# Patient Record
Sex: Male | Born: 1955 | Race: White | Hispanic: No | Marital: Married | Smoking: Former smoker
Health system: Southern US, Community
[De-identification: ages and names within clinical notes are randomized; demographics above are authoritative.]

## PROBLEM LIST (undated history)

## (undated) DIAGNOSIS — K5790 Diverticulosis of intestine, part unspecified, without perforation or abscess without bleeding: Secondary | ICD-10-CM

## (undated) DIAGNOSIS — D696 Thrombocytopenia, unspecified: Secondary | ICD-10-CM

## (undated) DIAGNOSIS — I10 Essential (primary) hypertension: Secondary | ICD-10-CM

## (undated) DIAGNOSIS — E785 Hyperlipidemia, unspecified: Secondary | ICD-10-CM

## (undated) DIAGNOSIS — L309 Dermatitis, unspecified: Secondary | ICD-10-CM

## (undated) DIAGNOSIS — K635 Polyp of colon: Secondary | ICD-10-CM

## (undated) DIAGNOSIS — R51 Headache: Secondary | ICD-10-CM

## (undated) DIAGNOSIS — R748 Abnormal levels of other serum enzymes: Secondary | ICD-10-CM

## (undated) DIAGNOSIS — D751 Secondary polycythemia: Secondary | ICD-10-CM

## (undated) DIAGNOSIS — R0989 Other specified symptoms and signs involving the circulatory and respiratory systems: Secondary | ICD-10-CM

## (undated) DIAGNOSIS — K219 Gastro-esophageal reflux disease without esophagitis: Secondary | ICD-10-CM

## (undated) DIAGNOSIS — M1990 Unspecified osteoarthritis, unspecified site: Secondary | ICD-10-CM

## (undated) DIAGNOSIS — G8929 Other chronic pain: Secondary | ICD-10-CM

## (undated) DIAGNOSIS — K222 Esophageal obstruction: Secondary | ICD-10-CM

## (undated) DIAGNOSIS — T7840XA Allergy, unspecified, initial encounter: Secondary | ICD-10-CM

## (undated) HISTORY — PX: CATARACT EXTRACTION W/ INTRAOCULAR LENS  IMPLANT, BILATERAL: SHX1307

## (undated) HISTORY — DX: Dermatitis, unspecified: L30.9

## (undated) HISTORY — DX: Other specified symptoms and signs involving the circulatory and respiratory systems: R09.89

## (undated) HISTORY — DX: Abnormal levels of other serum enzymes: R74.8

## (undated) HISTORY — DX: Thrombocytopenia, unspecified: D69.6

## (undated) HISTORY — PX: CERVICAL DISC SURGERY: SHX588

## (undated) HISTORY — DX: Other chronic pain: G89.29

## (undated) HISTORY — DX: Allergy, unspecified, initial encounter: T78.40XA

## (undated) HISTORY — PX: KNEE ARTHROSCOPY: SUR90

## (undated) HISTORY — DX: Secondary polycythemia: D75.1

## (undated) HISTORY — PX: BACK SURGERY: SHX140

## (undated) HISTORY — DX: Unspecified osteoarthritis, unspecified site: M19.90

## (undated) HISTORY — DX: Hyperlipidemia, unspecified: E78.5

## (undated) HISTORY — DX: Essential (primary) hypertension: I10

## (undated) HISTORY — DX: Polyp of colon: K63.5

## (undated) HISTORY — DX: Esophageal obstruction: K22.2

## (undated) HISTORY — DX: Headache: R51

## (undated) HISTORY — DX: Diverticulosis of intestine, part unspecified, without perforation or abscess without bleeding: K57.90

## (undated) HISTORY — DX: Gastro-esophageal reflux disease without esophagitis: K21.9

## (undated) HISTORY — PX: UPPER GASTROINTESTINAL ENDOSCOPY: SHX188

---

## 1999-04-17 HISTORY — PX: ESOPHAGOGASTRODUODENOSCOPY: SHX1529

## 2000-03-06 ENCOUNTER — Ambulatory Visit (HOSPITAL_COMMUNITY): Admission: RE | Admit: 2000-03-06 | Discharge: 2000-03-06 | Payer: Self-pay | Admitting: Internal Medicine

## 2000-03-06 ENCOUNTER — Encounter: Payer: Self-pay | Admitting: Internal Medicine

## 2000-04-16 HISTORY — PX: OTHER SURGICAL HISTORY: SHX169

## 2000-08-29 ENCOUNTER — Encounter: Admission: RE | Admit: 2000-08-29 | Discharge: 2000-08-29 | Payer: Self-pay | Admitting: Family Medicine

## 2000-08-29 ENCOUNTER — Encounter: Payer: Self-pay | Admitting: Family Medicine

## 2002-04-15 ENCOUNTER — Emergency Department (HOSPITAL_COMMUNITY): Admission: EM | Admit: 2002-04-15 | Discharge: 2002-04-15 | Payer: Self-pay | Admitting: Emergency Medicine

## 2002-07-25 ENCOUNTER — Encounter: Payer: Self-pay | Admitting: Emergency Medicine

## 2002-07-25 ENCOUNTER — Emergency Department (HOSPITAL_COMMUNITY): Admission: EM | Admit: 2002-07-25 | Discharge: 2002-07-25 | Payer: Self-pay | Admitting: Emergency Medicine

## 2002-07-27 ENCOUNTER — Encounter: Payer: Self-pay | Admitting: Orthopedic Surgery

## 2002-07-27 ENCOUNTER — Inpatient Hospital Stay (HOSPITAL_COMMUNITY): Admission: EM | Admit: 2002-07-27 | Discharge: 2002-08-10 | Payer: Self-pay | Admitting: Orthopedic Surgery

## 2002-07-29 ENCOUNTER — Encounter: Payer: Self-pay | Admitting: Orthopedic Surgery

## 2002-07-30 ENCOUNTER — Encounter: Payer: Self-pay | Admitting: Infectious Diseases

## 2002-07-30 ENCOUNTER — Encounter: Payer: Self-pay | Admitting: Orthopedic Surgery

## 2002-08-01 ENCOUNTER — Encounter: Payer: Self-pay | Admitting: Orthopedic Surgery

## 2002-08-03 ENCOUNTER — Encounter (INDEPENDENT_AMBULATORY_CARE_PROVIDER_SITE_OTHER): Payer: Self-pay | Admitting: Cardiovascular Disease

## 2002-08-03 ENCOUNTER — Encounter: Payer: Self-pay | Admitting: Orthopedic Surgery

## 2002-09-01 ENCOUNTER — Encounter: Payer: Self-pay | Admitting: Orthopedic Surgery

## 2002-09-01 ENCOUNTER — Encounter: Admission: RE | Admit: 2002-09-01 | Discharge: 2002-09-01 | Payer: Self-pay | Admitting: Orthopedic Surgery

## 2002-09-22 ENCOUNTER — Encounter: Admission: RE | Admit: 2002-09-22 | Discharge: 2002-09-22 | Payer: Self-pay | Admitting: Infectious Diseases

## 2002-10-21 ENCOUNTER — Encounter: Admission: RE | Admit: 2002-10-21 | Discharge: 2002-10-21 | Payer: Self-pay | Admitting: Infectious Diseases

## 2002-11-23 ENCOUNTER — Encounter: Admission: RE | Admit: 2002-11-23 | Discharge: 2002-11-23 | Payer: Self-pay | Admitting: Infectious Diseases

## 2003-01-13 ENCOUNTER — Encounter: Admission: RE | Admit: 2003-01-13 | Discharge: 2003-01-13 | Payer: Self-pay | Admitting: Infectious Diseases

## 2003-03-15 ENCOUNTER — Encounter: Admission: RE | Admit: 2003-03-15 | Discharge: 2003-03-15 | Payer: Self-pay | Admitting: Infectious Diseases

## 2004-02-22 ENCOUNTER — Ambulatory Visit: Payer: Self-pay | Admitting: Family Medicine

## 2004-04-07 ENCOUNTER — Ambulatory Visit: Payer: Self-pay | Admitting: Internal Medicine

## 2004-04-18 ENCOUNTER — Ambulatory Visit: Payer: Self-pay | Admitting: Family Medicine

## 2004-04-20 ENCOUNTER — Ambulatory Visit: Payer: Self-pay | Admitting: Family Medicine

## 2004-06-23 ENCOUNTER — Ambulatory Visit: Payer: Self-pay | Admitting: Family Medicine

## 2004-10-19 ENCOUNTER — Ambulatory Visit: Payer: Self-pay | Admitting: Family Medicine

## 2005-04-16 HISTORY — PX: CHOLECYSTECTOMY: SHX55

## 2005-06-06 ENCOUNTER — Ambulatory Visit: Payer: Self-pay | Admitting: Internal Medicine

## 2005-09-06 ENCOUNTER — Emergency Department (HOSPITAL_COMMUNITY): Admission: EM | Admit: 2005-09-06 | Discharge: 2005-09-07 | Payer: Self-pay | Admitting: Emergency Medicine

## 2005-09-17 ENCOUNTER — Emergency Department (HOSPITAL_COMMUNITY): Admission: EM | Admit: 2005-09-17 | Discharge: 2005-09-17 | Payer: Self-pay | Admitting: Emergency Medicine

## 2006-02-19 ENCOUNTER — Ambulatory Visit: Payer: Self-pay | Admitting: Family Medicine

## 2006-04-11 ENCOUNTER — Ambulatory Visit: Payer: Self-pay | Admitting: Internal Medicine

## 2006-04-11 ENCOUNTER — Inpatient Hospital Stay (HOSPITAL_COMMUNITY): Admission: EM | Admit: 2006-04-11 | Discharge: 2006-04-15 | Payer: Self-pay | Admitting: Emergency Medicine

## 2006-04-13 ENCOUNTER — Encounter (INDEPENDENT_AMBULATORY_CARE_PROVIDER_SITE_OTHER): Payer: Self-pay | Admitting: Specialist

## 2006-04-15 ENCOUNTER — Ambulatory Visit: Payer: Self-pay | Admitting: Gastroenterology

## 2006-04-22 ENCOUNTER — Ambulatory Visit: Payer: Self-pay | Admitting: Family Medicine

## 2006-05-03 ENCOUNTER — Ambulatory Visit: Payer: Self-pay | Admitting: Family Medicine

## 2006-05-03 LAB — CONVERTED CEMR LAB
ALT: 66 units/L — ABNORMAL HIGH (ref 0–40)
AST: 54 units/L — ABNORMAL HIGH (ref 0–37)
Albumin: 3.5 g/dL (ref 3.5–5.2)
Alkaline Phosphatase: 138 units/L — ABNORMAL HIGH (ref 39–117)
Basophils Absolute: 0.1 10*3/uL (ref 0.0–0.1)
Basophils Relative: 1 % (ref 0.0–1.0)
Bilirubin, Direct: 0.5 mg/dL — ABNORMAL HIGH (ref 0.0–0.3)
Eosinophils Relative: 3.7 % (ref 0.0–5.0)
HCT: 48.4 % (ref 39.0–52.0)
Hemoglobin: 16.2 g/dL (ref 13.0–17.0)
Lymphocytes Relative: 21.5 % (ref 12.0–46.0)
MCHC: 33.4 g/dL (ref 30.0–36.0)
MCV: 85 fL (ref 78.0–100.0)
Monocytes Absolute: 0.5 10*3/uL (ref 0.2–0.7)
Monocytes Relative: 9.2 % (ref 3.0–11.0)
Neutro Abs: 3.3 10*3/uL (ref 1.4–7.7)
Neutrophils Relative %: 64.6 % (ref 43.0–77.0)
Platelets: 206 10*3/uL (ref 150–400)
RBC: 5.69 M/uL (ref 4.22–5.81)
RDW: 12.4 % (ref 11.5–14.6)
Total Bilirubin: 1.4 mg/dL — ABNORMAL HIGH (ref 0.3–1.2)
Total Protein: 6.3 g/dL (ref 6.0–8.3)
WBC: 5.2 10*3/uL (ref 4.5–10.5)

## 2006-05-17 ENCOUNTER — Ambulatory Visit: Payer: Self-pay | Admitting: Internal Medicine

## 2007-01-10 ENCOUNTER — Encounter: Payer: Self-pay | Admitting: Family Medicine

## 2007-02-18 IMAGING — NM NM LIVER FUNCTION STUDY
1 series · 6 of 6 positions shown · non-contrast
Comparison: None. Ultrasound exam from earlier today has been reviewed.

CLINICAL DATA: Gallstones with prominent gallbladder wall on recent ultrasound

NM HEPATOBILIARY SCAN
TECHNIQUE: Sequential abdominal images were obtained following intravenous
injection of radiopharmaceutical.
Radiopharmaceutical:  5 mCi Wc-88m Choletec

[Series 1: he hepatobiliary · 3.21mm/px · 6 of 60 frames shown]
[frame 6/60]
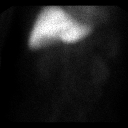
[frame 16/60]
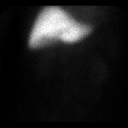
[frame 26/60]
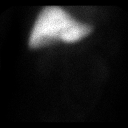
[frame 36/60]
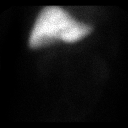
[frame 46/60]
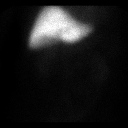
[frame 56/60]
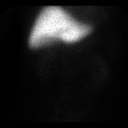

[6 of 6 positions shown; findings below may reference images not displayed]

FINDINGS: There is relatively good radiotracer uptake by the hepatic parenchyma
with blood pool activity cleared by 6-7 minutes. However no excretion of
radiotracer from the hepatic parenchyma is identified. There is no evidence for
biliary activity and no common duct activity is present by the 60 minute
acquisition.

IMPRESSION

No biliary excretion of radiotracer by 60 minutes after radioisotope injection.
The 2 main differential considerations for this appearance are acute common duct
obstruction or hepatocyte dysfunction. Given that the radiotracer uptake by the
hepatocytes appears to be normal, with appropriate clearance of activity from
the blood pool, an acute common duct obstruction is considered more likely.
While the ultrasound performed earlier today did not demonstrate biliary
dilation, this may represent an acute process with the ultrasound exam obtained
before biliary distention has occurred. Close clinical correlation is
recommended.

I called these results directly to Dr. Deeqa Rayaan at the time of study
interpretation.

## 2007-03-28 ENCOUNTER — Encounter: Payer: Self-pay | Admitting: Family Medicine

## 2007-06-04 ENCOUNTER — Ambulatory Visit: Payer: Self-pay | Admitting: Internal Medicine

## 2009-02-28 ENCOUNTER — Ambulatory Visit: Payer: Self-pay | Admitting: Family Medicine

## 2009-02-28 DIAGNOSIS — E1169 Type 2 diabetes mellitus with other specified complication: Secondary | ICD-10-CM | POA: Insufficient documentation

## 2009-02-28 DIAGNOSIS — R030 Elevated blood-pressure reading, without diagnosis of hypertension: Secondary | ICD-10-CM | POA: Insufficient documentation

## 2009-02-28 DIAGNOSIS — E785 Hyperlipidemia, unspecified: Secondary | ICD-10-CM

## 2009-03-01 LAB — CONVERTED CEMR LAB
Albumin: 4.1 g/dL (ref 3.5–5.2)
Alkaline Phosphatase: 123 units/L — ABNORMAL HIGH (ref 39–117)
Basophils Absolute: 0 10*3/uL (ref 0.0–0.1)
Basophils Relative: 0.1 % (ref 0.0–3.0)
Bilirubin, Direct: 0.1 mg/dL (ref 0.0–0.3)
CO2: 32 meq/L (ref 19–32)
Calcium: 9.5 mg/dL (ref 8.4–10.5)
Chloride: 101 meq/L (ref 96–112)
Creatinine, Ser: 1 mg/dL (ref 0.4–1.5)
Eosinophils Relative: 2 % (ref 0.0–5.0)
HCT: 54.7 % — ABNORMAL HIGH (ref 39.0–52.0)
Hemoglobin: 17.9 g/dL — ABNORMAL HIGH (ref 13.0–17.0)
LDL Cholesterol: 93 mg/dL (ref 0–99)
Lymphocytes Relative: 19.3 % (ref 12.0–46.0)
MCHC: 32.7 g/dL (ref 30.0–36.0)
Monocytes Absolute: 0.7 10*3/uL (ref 0.1–1.0)
Neutro Abs: 4.8 10*3/uL (ref 1.4–7.7)
Neutrophils Relative %: 68.6 % (ref 43.0–77.0)
PSA: 2.01 ng/mL (ref 0.10–4.00)
Platelets: 136 10*3/uL — ABNORMAL LOW (ref 150.0–400.0)
Potassium: 4 meq/L (ref 3.5–5.1)
RDW: 12.2 % (ref 11.5–14.6)
Total Bilirubin: 0.9 mg/dL (ref 0.3–1.2)
Total Protein: 7 g/dL (ref 6.0–8.3)
Triglycerides: 183 mg/dL — ABNORMAL HIGH (ref 0.0–149.0)

## 2009-03-08 ENCOUNTER — Ambulatory Visit: Payer: Self-pay | Admitting: Family Medicine

## 2009-03-09 ENCOUNTER — Encounter (INDEPENDENT_AMBULATORY_CARE_PROVIDER_SITE_OTHER): Payer: Self-pay | Admitting: *Deleted

## 2009-05-31 ENCOUNTER — Ambulatory Visit: Payer: Self-pay | Admitting: Family Medicine

## 2009-06-03 LAB — CONVERTED CEMR LAB
ALT: 36 units/L (ref 0–53)
AST: 36 units/L (ref 0–37)
Albumin: 3.9 g/dL (ref 3.5–5.2)
Alkaline Phosphatase: 106 units/L (ref 39–117)
Basophils Relative: 0.8 % (ref 0.0–3.0)
CO2: 30 meq/L (ref 19–32)
Calcium: 9.8 mg/dL (ref 8.4–10.5)
Eosinophils Relative: 2.2 % (ref 0.0–5.0)
Hemoglobin: 16.3 g/dL (ref 13.0–17.0)
Lymphocytes Relative: 22.3 % (ref 12.0–46.0)
Lymphs Abs: 1.2 10*3/uL (ref 0.7–4.0)
MCHC: 34.4 g/dL (ref 30.0–36.0)
Monocytes Relative: 9.5 % (ref 3.0–12.0)
Neutro Abs: 3.5 10*3/uL (ref 1.4–7.7)
Neutrophils Relative %: 65.2 % (ref 43.0–77.0)
Potassium: 4.1 meq/L (ref 3.5–5.1)
Total Bilirubin: 0.6 mg/dL (ref 0.3–1.2)
Total CHOL/HDL Ratio: 4
Total Protein: 6.5 g/dL (ref 6.0–8.3)
Triglycerides: 190 mg/dL — ABNORMAL HIGH (ref 0.0–149.0)
WBC: 5.3 10*3/uL (ref 4.5–10.5)

## 2009-11-28 ENCOUNTER — Ambulatory Visit: Payer: Self-pay | Admitting: Family Medicine

## 2009-11-28 LAB — CONVERTED CEMR LAB
ALT: 35 units/L (ref 0–53)
Alkaline Phosphatase: 144 units/L — ABNORMAL HIGH (ref 39–117)
BUN: 20 mg/dL (ref 6–23)
Basophils Absolute: 0 10*3/uL (ref 0.0–0.1)
CO2: 29 meq/L (ref 19–32)
Calcium: 9.4 mg/dL (ref 8.4–10.5)
Cholesterol: 162 mg/dL (ref 0–200)
Eosinophils Relative: 3.7 % (ref 0.0–5.0)
LDL Cholesterol: 92 mg/dL (ref 0–99)
Lymphocytes Relative: 24.9 % (ref 12.0–46.0)
Monocytes Relative: 12.7 % — ABNORMAL HIGH (ref 3.0–12.0)
Neutro Abs: 3.2 10*3/uL (ref 1.4–7.7)
PSA: 1.36 ng/mL (ref 0.10–4.00)
Platelets: 133 10*3/uL — ABNORMAL LOW (ref 150.0–400.0)
Potassium: 4.2 meq/L (ref 3.5–5.1)
RBC: 5.61 M/uL (ref 4.22–5.81)
RDW: 14 % (ref 11.5–14.6)
Sodium: 142 meq/L (ref 135–145)
Total CHOL/HDL Ratio: 5
Total Protein: 6.3 g/dL (ref 6.0–8.3)
VLDL: 38.8 mg/dL (ref 0.0–40.0)

## 2009-12-02 ENCOUNTER — Ambulatory Visit: Payer: Self-pay | Admitting: Family Medicine

## 2010-01-30 ENCOUNTER — Encounter: Payer: Self-pay | Admitting: Family Medicine

## 2010-02-15 ENCOUNTER — Ambulatory Visit: Payer: Self-pay | Admitting: Internal Medicine

## 2010-05-18 NOTE — Letter (Signed)
Summary: [facility]  [facility]   Imported By: Lanelle Bal 02/08/2010 13:47:33  _____________________________________________________________________  External Attachment:    Type:   Image     Comment:   External Document

## 2010-05-18 NOTE — Assessment & Plan Note (Signed)
Summary: BACK PAIN/CLE   Vital Signs:  Patient profile:   55 year old male Weight:      213.50 pounds Temp:     98.2 degrees F oral Pulse rate:   76 / minute Pulse rhythm:   regular BP sitting:   140 / 100  (left arm) Cuff size:   large  Vitals Entered By: Selena Batten Dance CMA (AAMA) (February 15, 2010 11:31 AM) CC: LBP   History of Present Illness: CC: LBP  1 1/2 mo h/o lower back pain.  Started when went to tie shoe, felt pulled something in back (started on R side).  Saw Dr. Juliene Pina 2 wks ago, didn't find any abnormality, didn't have any pain, told fine.  Last week was working on computer in truck, felt stabbing sensation L side.  h/o back surgery for ruptured disc with staph infection.  Has tried advil, Kingsley Callander, goody powders.    Mainly nuisance.  Pain worse with transition from sitting to standing or when lifting something heavy, pain can get excruciating.  Using back brace.  Feels pain localized left lower back, radiates to L lateral hip.  No radiculopathy down leg, no numbness, weakness, no bowel/bladder incontinence.    Voiding more often, has been drinking more.  No dysuria, urgency.  No fevers/chills.  No nausea/vomiting.  Allergies: No Known Drug Allergies  Past History:  Past Medical History: Last updated: 02/28/2009 eczema hyperlipidemia GERD with HH elevated liver enzymes  allergies labile bp   Past Surgical History: Last updated: 02/28/2009 01 EGD stricture/GERD and HH R knee surg 02 abd [place] neg  disc surgery with staph infx [facility] 07 PMH-FH-SH reviewed for relevance  Social History: Reviewed history from 02/28/2009 and no changes required. quit smoking over 20 years ago  alcohol - is rare to none  some exercise  married   Review of Systems       per HPI  Physical Exam  General:  Well-developed,well-nourished,in no acute distress; alert,appropriate and cooperative throughout examination Msk:  no midline spine tenderness.  no significant paraspinous  mm tenderness.  limited ROM at spine 2/2 stiffness, inflexibility.  No pain with ROM.  No SI joint pain, negative FABER test.  No greater trochanteric pain, no pain with int/ext rotation at hip.  negative SLR test.  tenderness to palpation L back about 2cm superior to posterior iliac crest.  no CVA tenderness Pulses:  2+ periph pulses Extremities:  no pedal edema, no palp cords Neurologic:  sensation intact to light touch, station and gait intact   Impression & Recommendations:  Problem # 1:  LUMBAR STRAIN (ICD-847.2) no red flags.  treat conservatively for now, see pt instructions.  red flags to return dsicused, if not improving to return.  consider UA vs Xray.  Complete Medication List: 1)  Lansoprazole 30 Mg Cpdr (Lansoprazole) .Marland Kitchen.. 1 capsule by mouth once daily 2)  Multivitamins Tabs (Multiple vitamin) .... One daily 3)  Vitamin B Complex-c Caps (B complex-c) .... One daily 4)  Calcium Carbonate-vitamin D 600-400 Mg-unit Tabs (Calcium carbonate-vitamin d) .... One daily 5)  Isochrome Otc  .... Otc as directed. 6)  Opc3  .... Otc as directed. 7)  Advil 200 Mg Tabs (Ibuprofen) .... Otc as directed. 8)  Goodys Body Pain 500-325 Mg Pack (Aspirin-acetaminophen) .... Otc as directed. 9)  Epipen 0.3 Mg/0.85ml Devi (Epinephrine) .... Inject one time as directed as needed allergic reaction 10)  Mobic 7.5 Mg Tabs (Meloxicam) .... One daily for 7 dyas then as needed (  don't take with any other goody or advil) 11)  Flexeril 10 Mg Tabs (Cyclobenzaprine hcl) .... One by mouth two times a day x 7 dyas then as needed muscle spasm  Patient Instructions: 1)  I think you have a lumbar strain. 2)  Treat with anti inflammatory for 7 days with food, as well as muscle relaxant flexeril two times a day, can make you sleepy so start at night.  (no advil, goody powder, alleve while on mobic). 3)  Handout today. 4)  Good to see you call clinic with quesitons. 5)  if not better in 2 wks, please return to be  seen.  if worsening, return sooner. Prescriptions: MOBIC 7.5 MG TABS (MELOXICAM) one daily for 7 dyas then as needed (don't take with any other goody or advil)  #30 x 0   Entered and Authorized by:   Eustaquio Boyden  MD   Signed by:   Eustaquio Boyden  MD on 02/15/2010   Method used:   Electronically to        [facility]  [address] [id number] (retail[facility]       [address]       [place], [place]  [address]       Ph: [phone] or [phone]       Fax: [phone]   RxID:   [id number] FLEXERIL 10 MG TABS (CYCLOBENZAPRINE HCL) one by mouth two times a day x 7 dyas then as needed muscle spasm  #30 x 0   Entered and Authorized by:   Eustaquio Boyden  MD   Signed by:   Eustaquio Boyden  MD on 02/15/2010   Method used:   [facility] to        [facility]  [address] [facility]ret[facility]       [address]       [place], [place]  [address]       Ph: [phone] or [phone]       Fax: [phone]   RxID:   [id number] MOBIC 7.5 MG TABS (MELOXICAM) one daily for 7 dyas then as needed (don't take with any other goody or advil)  #30 x 0   Entered and Authorized by:   Eustaquio Boyden  MD   Signed by:   Eustaquio Boyden  MD on 02/15/2010   Method used:   Electronically to        [facility] [phone]* (retail)       [address]       [place], [place]  [address]       Ph: [phone]       Fax: [phone]   RxID:   [id number]    Orders Added: 1)  Est. Patient Level III [82423]    Current Allergies (reviewed today): No known allergies

## 2010-05-18 NOTE — Assessment & Plan Note (Signed)
Summary: F/U AFTER LABS / LFW   Vital Signs:  Patient profile:   55 year old male Height:      72.25 inches Weight:      217.75 pounds BMI:     29.43 Temp:     97.8 degrees F oral Pulse rate:   72 / minute Pulse rhythm:   regular BP sitting:   120 / 80  (left arm) Cuff size:   large  Vitals Entered By: Lewanda Rife LPN (December 02, 2009 8:13 AM) CC: follow-up visit   History of Present Illness: pt here for f/u of lipids and bp and hyperglycemia   is feeling good overall  a busy summer  got some swelling in feet at the beach -- given shot of steroid and then prednisone (from sunburn)  glucose 111 and AIC 5.8  platelets 133-- no change   alk phos 144- thins tends to go up and down -baseline for him  wt is up 1 lb here-- lost 2 lb by scales at home  did start cutting down on his portions   bp good today 120/80  Last Lipid ProfileCholesterol: 162 (11/28/2009 8:47:40 AM)HDL:  31.70 (11/28/2009 8:47:40 AM)LDL:  92 (11/28/2009 8:47:40 AM)Triglycerides:  Last Liver profileSGOT:  35 (11/28/2009 8:47:40 AM)SPGT:  35 (11/28/2009 8:47:40 AM)T. Bili:  0.9 (11/28/2009 8:47:40 AM)Alk Phos:  144 (11/28/2009 8:47:40 AM)  this is fairly stable   psa is normal- lower than last time  no urination trouble at all -- no nocturia   Allergies (verified): No Known Drug Allergies  Past History:  Past Medical History: Last updated: 02/28/2009 eczema hyperlipidemia GERD with HH elevated liver enzymes  allergies labile bp   Past Surgical History: Last updated: 02/28/2009 01 EGD stricture/GERD and HH R knee surg 02 abd [place] neg  disc surgery with staph infx ccy 07  Family History: Last updated: 02/28/2009 mother father HTN, passed of heart problems (? heart dz)/ CABG lung dz (? auto immune)  MGF MI in his 77s   Social History: Last updated: 02/28/2009 quit smoking over 20 years ago  alcohol - is rare to none  some exercise  married   Review of Systems General:  Denies  fatigue, fever, loss of appetite, and malaise. Eyes:  Denies blurring and eye irritation. CV:  Denies chest pain or discomfort, fatigue, shortness of breath with exertion, and swelling of feet. Resp:  Denies cough, shortness of breath, and wheezing. GI:  Denies abdominal pain, bloody stools, change in bowel habits, indigestion, and nausea. GU:  Denies nocturia, urinary frequency, and urinary hesitancy. MS:  Denies joint pain and muscle aches. Derm:  Denies itching, lesion(s), poor wound healing, and rash. Neuro:  Denies numbness and tingling. Psych:  Denies anxiety and depression. Endo:  Denies cold intolerance, excessive thirst, excessive urination, and heat intolerance. Heme:  Denies abnormal bruising and bleeding.  Physical Exam  General:  Well-developed,well-nourished,in no acute distress; alert,appropriate and cooperative throughout examination Head:  normocephalic, atraumatic, and no abnormalities observed.   Eyes:  vision grossly intact, pupils equal, pupils round, and pupils reactive to light.  no conjunctival pallor, injection or icterus  Mouth:  pharynx pink and moist.   Neck:  supple with full rom and no masses or thyromegally, no JVD or carotid bruit  Chest Wall:  No deformities, masses, tenderness or gynecomastia noted. Lungs:  Normal respiratory effort, chest expands symmetrically. Lungs are clear to auscultation, no crackles or wheezes. Heart:  Normal rate and regular rhythm. S1 and S2  normal without gallop, murmur, click, rub or other extra sounds. Abdomen:  Bowel sounds positive,abdomen soft and non-tender without masses, organomegaly or hernias noted. no renal bruits  Msk:  trigger fingers R 3rd and 4th- mild  Pulses:  R and L carotid,radial,femoral,dorsalis pedis and posterior tibial pulses are full and equal bilaterally Extremities:  No clubbing, cyanosis, edema, or deformity noted with normal full range of motion of all joints.   Neurologic:  sensation intact to light  touch, gait normal, and DTRs symmetrical and normal.   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    Problem # 3:  HYPERLIPIDEMIA, MILD (ICD-272.4) Assessment: Unchanged  no changes disc low sat fat diet in detail re check 6 mo and f/u   Labs Reviewed: SGOT: 35 (11/28/2009)   SGPT: 35 (11/28/2009)   HDL:31.70 (11/28/2009), 37.20 (05/31/2009)  LDL:92 (11/28/2009), 87 (05/31/2009)  Chol:162 (11/28/2009), 162 (05/31/2009)  Trig:194.0 (11/28/2009), 190.0 (05/31/2009)  Problem # 4:  HYPERGLYCEMIA (ICD-790.29) Assessment: Unchanged  with AIC 5.8 disc imp of low sugar diet and wt loss disc healthy diet (low simple sugar/ choose complex carbs/ low sat fat) diet and exercise in detail re check in 6 mo   Labs Reviewed: Creat: 0.9 (11/28/2009)     Complete Medication List: 1)  Lansoprazole 30 Mg Cpdr (Lansoprazole) .Marland Kitchen.. 1 capsule by mouth once daily 2)  Multivitamins Tabs (Multiple vitamin) .... One daily 3)  Vitamin B Complex-c Caps (B complex-c) .... One daily 4)  Calcium Carbonate-vitamin D 600-400 Mg-unit Tabs (Calcium carbonate-vitamin d) .... One daily 5)  Isochrome Otc  .... Otc as directed. 6)  Opc3  .... Otc as directed. 7)  Advil 200 Mg Tabs (Ibuprofen)  .... Otc as directed. 8)  Goodys Body Pain 500-325 Mg Pack (Aspirin-acetaminophen) .... Otc as directed. 9)  Epipen 0.3 Mg/0.79ml Devi (Epinephrine) .... Inject one time as directed as needed allergic reaction  Patient Instructions: 1)  keep doing best you can with diet and exercise 2)  work on weight loss- this is the most important factor in preventing diabetes (quit sweet tea - use artificial sweetner)  3)  stop all sweets and sugar drinks  4)  stick with water without sugar  5)  stay as active as you can  6)  blood pressure is better today 7)  schedule fasting lab in 6 months and then PE  8)  lipid/wellness v70.0, 272 , and AIc for hyperglycemia   Current Allergies (reviewed today): No known allergies

## 2010-05-18 NOTE — Assessment & Plan Note (Signed)
Summary: FOLLOW UP / LFW   Vital Signs:  Patient profile:   55 year old male Height:      72.25 inches Weight:      216.25 pounds BMI:     29.23 Temp:     97.5 degrees F oral Pulse rate:   72 / minute Pulse rhythm:   regular BP sitting:   120 / 82  (right arm) Cuff size:   large  Vitals Entered By: Lewanda Rife LPN (May 31, 2009 8:35 AM)  History of Present Illness: feels good overall  gets tired from work in general- a lot of hours   here for f/u of HTN and lipids and fatty liver also high HB and slt low platelets is around a lot of exhaust smoke   last lab hb 17.9 and platelet 136  lipids trig 183/ HDL 30 and LDL 93 is trying to keep out the fat and salt in his diet  is eating more vegetables    bp 140/86 today  is tired and a little stressed today   wt is up 4 lb with bmi of 29 from the holidays- was slack a bit  per his scale -- is down a bit  wearing boots today   he did give blood last week    Allergies (verified): No Known Drug Allergies  Past History:  Past Medical History: Last updated: 02/28/2009 eczema hyperlipidemia GERD with HH elevated liver enzymes  allergies labile bp   Past Surgical History: Last updated: 02/28/2009 01 EGD stricture/GERD and HH R knee surg 02 abd [place] neg  disc surgery with staph infx ccy 07  Family History: Last updated: 02/28/2009 mother father HTN, passed of heart problems (? heart dz)/ CABG lung dz (? auto immune)  MGF MI in his 65s   Social History: Last updated: 02/28/2009 quit smoking over 20 years ago  alcohol - is rare to none  some exercise  married   Review of Systems General:  Denies fatigue, fever, loss of appetite, and malaise. Eyes:  Denies blurring and eye pain. CV:  Denies chest pain or discomfort, lightheadness, palpitations, shortness of breath with exertion, and swelling of feet. Resp:  Denies cough and wheezing. GI:  Denies abdominal pain, bloody stools, change in bowel  habits, indigestion, and nausea. MS:  Denies muscle aches. Derm:  Denies lesion(s), poor wound healing, and rash. Neuro:  Denies numbness and tingling. Psych:  Denies anxiety and depression. Endo:  Denies cold intolerance, excessive thirst, excessive urination, and heat intolerance. Heme:  Denies abnormal bruising and bleeding.  Physical Exam  General:  Well-developed,well-nourished,in no acute distress; alert,appropriate and cooperative throughout examination Head:  normocephalic, atraumatic, and no abnormalities observed.   Eyes:  vision grossly intact, pupils equal, pupils round, and pupils reactive to light.  no conjunctival pallor, injection or icterus  Mouth:  pharynx pink and moist.   Neck:  supple with full rom and no masses or thyromegally, no JVD or carotid bruit  Chest Wall:  No deformities, masses, tenderness or gynecomastia noted. Lungs:  Normal respiratory effort, chest expands symmetrically. Lungs are clear to auscultation, no crackles or wheezes. Heart:  Normal rate and regular rhythm. S1 and S2 normal without gallop, murmur, click, rub or other extra sounds. Abdomen:  Bowel sounds positive,abdomen soft and non-tender without masses, organomegaly or hernias noted. no renal bruits  Msk:  No deformity or scoliosis noted of thoracic or lumbar spine.   Extremities:  No clubbing, cyanosis, edema, or deformity noted  with normal full range of motion of all joints.   Neurologic:  sensation intact to light touch, gait normal, and DTRs symmetrical and normal.   Skin:  Intact without suspicious lesions or rashes no brusing or thin skin Cervical Nodes:  No lymphadenopathy noted Inguinal Nodes:  No significant adenopathy Psych:  normal affect, talkative and pleasant    Impression & Recommendations:  Problem # 1:  ELEVATED BLOOD PRESSURE WITHOUT DIAGNOSIS OF HYPERTENSION (ICD-796.2) Assessment Unchanged  again much imp on second check today disc low salt diet- doing better with  that  lab  f/u 6 mo  Venipuncture ([address]) TLB-Lipid Panel (80061-LIPID) TLB-Renal Function Panel (80069-RENAL) TLB-Hepatic/Liver Function Pnl (80076-HEPATIC) TLB-CBC Platelet - w/Differential (85025-CBCD)  BP today: 120/82 Prior BP: 146/92 (02/28/2009)  Labs Reviewed: Creat: 1.0 (02/28/2009) Chol: 160 (02/28/2009)   HDL: 30.90 (02/28/2009)   LDL: 93 (02/28/2009)   TG: 183.0 (02/28/2009)  Instructed in low sodium diet (DASH Handout) and behavior modification.    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.5) Assessment: New new and very mild with no brusing or bleeding  also high HB - pt has not smoked in 20 y  no symptoms  re check today Orders: Venipuncture ([address]) TLB-Lipid Panel (80061-LIPID) TLB-Renal Function Panel (80069-RENAL) TLB-Hepatic/Liver Function Pnl (80076-HEPATIC) TLB-CBC Platelet - w/Differential (85025-CBCD) Prescription Created Electronically [id number])  Problem # 3:  HYPERLIPIDEMIA, MILD (ICD-272.4) Assessment: Deteriorated  with high trig  re check today on lower fat diet and update f/u 6 mo  Orders: Venipuncture ([address]) TLB-Lipid Panel (80061-LIPID) TLB-Renal Function Panel (80069-RENAL) TLB-Hepatic/Liver Function Pnl (80076-HEPATIC) TLB-CBC Platelet - w/Differential (85025-CBCD) Prescription Created Electronically [id number])  Labs Reviewed: SGOT: 32 (02/28/2009)   SGPT: 36 (02/28/2009)   HDL:30.90 (02/28/2009)  LDL:93 (02/28/2009)  Chol:160 (02/28/2009)  Trig:183.0 (02/28/2009)  Complete Medication List: 1)  Lansoprazole 30 Mg Cpdr (Lansoprazole) .Marland Kitchen.. 1 capsule by mouth once daily 2)  Multivitamins Tabs (Multiple vitamin) .... One daily 3)  Vitamin B Complex-c Caps (B complex-c) .... One daily 4)  Calcium Carbonate-vitamin D 600-400 Mg-unit Tabs (Calcium carbonate-vitamin d) .... One daily 5)  Isochrome Otc  .... Otc as directed. 6)  Opc3  .... Otc as directed. 7)  Advil 200 Mg Tabs (Ibuprofen) .... Otc as directed. 8)  Goodys Body Pain 500-325  Mg Pack (Aspirin-acetaminophen) .... Otc as directed. 9)  Epipen 0.3 Mg/0.36ml Devi (Epinephrine) .... Inject one time as directed as needed allergic reaction  Patient Instructions: 1)  blood pressure is good today 2)  keep working on healthy diet and exercise  3)  you can raise your HDL (good cholesterol) by increasing exercise and eating omega 3 fatty acid supplement like fish oil or flax seed oil over the counter 4)  you can lower LDL (bad cholesterol) by limiting saturated fats in diet like red meat, fried foods, egg yolks, fatty breakfast meats, high fat dairy products and shellfish  5)  labs today for cholesterol and blood count  6)  schedule fasting lab and f/u in 6 months lipid/ hepatic/cbc with diff/ renal 401.1, 272  Prescriptions: LANSOPRAZOLE 30 MG CPDR (LANSOPRAZOLE) 1 capsule by mouth once daily  #30 x 11   Entered and Authorized by:   Judith Part MD   Signed by:   Judith Part MD on 05/31/2009   Method used:   Electronically to        [id number]* (retail)       [address]       [place]  [place], [place]  [address]       Ph: [phone]       Fax: [phone]   RxID:   [id number]   Current Allergies (reviewed today): No known allergies

## 2010-06-05 ENCOUNTER — Other Ambulatory Visit (INDEPENDENT_AMBULATORY_CARE_PROVIDER_SITE_OTHER): Payer: PRIVATE HEALTH INSURANCE

## 2010-06-05 ENCOUNTER — Other Ambulatory Visit: Payer: Self-pay | Admitting: Family Medicine

## 2010-06-05 ENCOUNTER — Encounter (INDEPENDENT_AMBULATORY_CARE_PROVIDER_SITE_OTHER): Payer: Self-pay | Admitting: *Deleted

## 2010-06-05 DIAGNOSIS — E785 Hyperlipidemia, unspecified: Secondary | ICD-10-CM

## 2010-06-05 DIAGNOSIS — Z0000 Encounter for general adult medical examination without abnormal findings: Secondary | ICD-10-CM

## 2010-06-05 DIAGNOSIS — R7309 Other abnormal glucose: Secondary | ICD-10-CM

## 2010-06-05 DIAGNOSIS — R030 Elevated blood-pressure reading, without diagnosis of hypertension: Secondary | ICD-10-CM

## 2010-06-05 LAB — CBC WITH DIFFERENTIAL/PLATELET
Basophils Relative: 0.5 % (ref 0.0–3.0)
Eosinophils Absolute: 0.2 10*3/uL (ref 0.0–0.7)
Eosinophils Relative: 3.1 % (ref 0.0–5.0)
HCT: 50.2 % (ref 39.0–52.0)
MCV: 87 fl (ref 78.0–100.0)
Monocytes Absolute: 0.5 10*3/uL (ref 0.1–1.0)
Neutro Abs: 3.2 10*3/uL (ref 1.4–7.7)
RBC: 5.78 Mil/uL (ref 4.22–5.81)
WBC: 5 10*3/uL (ref 4.5–10.5)

## 2010-06-05 LAB — LIPID PANEL
HDL: 31 mg/dL — ABNORMAL LOW (ref >39.00)
Triglycerides: 152 mg/dL — ABNORMAL HIGH (ref 0.0–149.0)
VLDL: 30.4 mg/dL (ref 0.0–40.0)

## 2010-06-05 LAB — BASIC METABOLIC PANEL
BUN: 17 mg/dL (ref 6–23)
CO2: 31 mEq/L (ref 19–32)
Chloride: 106 mEq/L (ref 96–112)
GFR: 75.22 mL/min (ref >60.00)
Glucose, Bld: 114 mg/dL — ABNORMAL HIGH (ref 70–99)

## 2010-06-05 LAB — HEPATIC FUNCTION PANEL: Albumin: 3.8 g/dL (ref 3.5–5.2)

## 2010-06-05 LAB — TSH: TSH: 1.98 u[IU]/mL (ref 0.35–5.50)

## 2010-06-12 ENCOUNTER — Encounter: Payer: Self-pay | Admitting: Family Medicine

## 2010-06-12 ENCOUNTER — Encounter (INDEPENDENT_AMBULATORY_CARE_PROVIDER_SITE_OTHER): Payer: PRIVATE HEALTH INSURANCE | Admitting: Family Medicine

## 2010-06-12 DIAGNOSIS — Z0000 Encounter for general adult medical examination without abnormal findings: Secondary | ICD-10-CM

## 2010-06-12 DIAGNOSIS — R51 Headache: Secondary | ICD-10-CM

## 2010-06-12 DIAGNOSIS — Z125 Encounter for screening for malignant neoplasm of prostate: Secondary | ICD-10-CM

## 2010-06-12 DIAGNOSIS — R519 Headache, unspecified: Secondary | ICD-10-CM | POA: Insufficient documentation

## 2010-06-12 DIAGNOSIS — D696 Thrombocytopenia, unspecified: Secondary | ICD-10-CM

## 2010-06-22 ENCOUNTER — Other Ambulatory Visit: Payer: Self-pay | Admitting: Family Medicine

## 2010-06-22 ENCOUNTER — Encounter (INDEPENDENT_AMBULATORY_CARE_PROVIDER_SITE_OTHER): Payer: Self-pay | Admitting: *Deleted

## 2010-06-22 ENCOUNTER — Other Ambulatory Visit: Payer: PRIVATE HEALTH INSURANCE

## 2010-06-22 DIAGNOSIS — Z1211 Encounter for screening for malignant neoplasm of colon: Secondary | ICD-10-CM

## 2010-06-22 LAB — FECAL OCCULT BLOOD, GUAIAC: Fecal Occult Blood: NEGATIVE

## 2010-06-22 LAB — FECAL OCCULT BLOOD, IMMUNOCHEMICAL: Fecal Occult Bld: POSITIVE

## 2010-06-22 NOTE — Letter (Signed)
Summary: [facility]: Immunoassay Fecal Occult Blood (iFOB) Order Form  [place] [facility]  [address] [place], [place] [address]   Phone: [phone]  Fax: [phone]      [facility]: Immunoassay Fecal Occult Blood (iFOB) Order Form   June 12, 2010 MRN: [id number]   ELAND LAMANTIA 03/07/1956   Physicican Name:________Tower_________________  Diagnosis Code:________V76.51__________________      Judith Part MD

## 2010-06-22 NOTE — Assessment & Plan Note (Signed)
Summary: CPX/JRR   Vital Signs:  Patient profile:   55 year old male Height:      71.75 inches Weight:      212.75 pounds BMI:     29.16 Temp:     97.5 degrees F oral Pulse rate:   80 / minute Pulse rhythm:   regular BP sitting:   132 / 84  (left arm) Cuff size:   large  Vitals Entered By: Lewanda Rife LPN (June 12, 2010 9:47 AM) CC: CPX   History of Present Illness: here for health mt exam and to rev chronic problems  overall no complaints   wt is up 1 lb with bmi of 29  bp is ok at 132/84 today  lipids are improved  Last Lipid ProfileCholesterol: 138 (06/05/2010 9:14:34 AM)HDL:  31.00 (06/05/2010 9:14:34 AM)LDL:  77 (06/05/2010 9:14:34 AM)Triglycerides:  Last Liver profileSGOT:  31 (06/05/2010 9:14:34 AM)SPGT:  32 (06/05/2010 9:14:34 AM)T. Bili:  0.6 (06/05/2010 9:14:34 AM)Alk Phos:  128 (06/05/2010 9:14:34 AM)  proud of this  tries to eat more fruits and vegetables   tranaminases nl  alk phos down at 128 from 140s  gluc 114 and AIC 5.8-- no change is trying to stay away from sugar and loose wt  be his scales lost from 214 to 204 - after holidays  wife it type 1 DM -- that helps family diet   platelet 136- stable (has always been borderline low ) platelet is 136   headaches occasionally -- wakes up with or from tension in neck-- goes away with goody powder within 30 minutes  gets these about once per month drinks lots of water  got Td when he had a hand injury with a wire 6 years ago   psa 1.36 in summer  no trouble urinating or nocturia   colon screen -- does not want colonosc yet  will do stool card     Allergies (verified): No Known Drug Allergies  Past History:  Past Medical History: Last updated: 02/28/2009 eczema hyperlipidemia GERD with HH elevated liver enzymes  allergies labile bp   Past Surgical History: Last updated: 02/28/2009 01 EGD stricture/GERD and HH R knee surg 02 abd [place] neg  disc surgery with staph infx ccy  07  Family History: Last updated: 02/28/2009 mother father HTN, passed of heart problems (? heart dz)/ CABG lung dz (? auto immune)  MGF MI in his 62s   Social History: Last updated: 06/12/2010 quit smoking over 20 years ago  alcohol - is rare to none  some exercise  married  hobby- hand making bullets   Social History: quit smoking over 20 years ago  alcohol - is rare to none  some exercise  married  hobby- hand making bullets   Review of Systems General:  Denies fatigue, loss of appetite, and malaise. Eyes:  Denies blurring and eye irritation. CV:  Denies chest pain or discomfort, lightheadness, and palpitations. Resp:  Denies cough, shortness of breath, and wheezing. GI:  Denies abdominal pain, change in bowel habits, indigestion, and nausea. GU:  Denies urinary frequency. MS:  Denies joint pain, cramps, and stiffness. Derm:  Denies itching, lesion(s), poor wound healing, and rash. Neuro:  Complains of headaches; denies sensation of room spinning and tingling; occ headaches . Psych:  Denies anxiety and depression. Endo:  Denies excessive thirst, excessive urination, and heat intolerance. Heme:  Denies abnormal bruising and bleeding.  Physical Exam  General:  Well-developed,well-nourished,in no acute distress; alert,appropriate and cooperative throughout examination  Head:  normocephalic, atraumatic, and no abnormalities observed.   Eyes:  vision grossly intact, pupils equal, pupils round, and pupils reactive to light.  no conjunctival pallor, injection or icterus  Ears:  R ear normal and L ear normal.  - scant cerumen Nose:  no nasal discharge.   Mouth:  pharynx pink and moist.   Neck:  supple with full rom and no masses or thyromegally, no JVD or carotid bruit  Chest Wall:  No deformities, masses, tenderness or gynecomastia noted. Lungs:  Normal respiratory effort, chest expands symmetrically. Lungs are clear to auscultation, no crackles or wheezes. Heart:  Normal  rate and regular rhythm. S1 and S2 normal without gallop, murmur, click, rub or other extra sounds. Abdomen:  Bowel sounds positive,abdomen soft and non-tender without masses, organomegaly or hernias noted. no renal bruits  Rectal:  No external abnormalities noted. Normal sphincter tone. No rectal masses or tenderness. Prostate:  Prostate gland firm and smooth, no enlargement, nodularity, tenderness, mass, asymmetry or induration. Msk:  No deformity or scoliosis noted of thoracic or lumbar spine.  no acute joint changes  Pulses:  2+ periph pulses Extremities:  no pedal edema, no palp cords Neurologic:  alert & oriented X3, cranial nerves II-XII intact, strength normal in all extremities, sensation intact to light touch, DTRs symmetrical and normal, and toes down bilaterally on Babinski.   Skin:  Intact without suspicious lesions or rashes several SKs on back and shoulders  Cervical Nodes:  No lymphadenopathy noted Inguinal Nodes:  No significant adenopathy Psych:  normal affect, talkative and pleasant    Impression & Recommendations:  Problem # 1:  HEALTH MAINTENANCE EXAM (ICD-V70.0) Assessment Comment Only reviewed health habits including diet, exercise and skin cancer prevention reviewed health maintenance list and family history rev labs in detail  rev lead exposure guidelines - pt adheres to them and does not want lead level checked   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.5) Assessment: Unchanged this is stable / unchanged  no clinical symptoms  will continue to follow   Problem # 3:  SPECIAL SCREENING MALIGNANT NEOPLASM OF PROSTATE (ICD-V76.44) Assessment: Comment Only psa from summer is WNL no symptoms nl DRE today  Problem # 4:  HEADACHE (ICD-784.0) Assessment: New fairly mild and occasional -- pt states this predated his lead exposure  quickly relieved by goody powder  disc imp of good fluid intake and caff avoidance  if worse I recommend lead testing and further  discussion The following medications were removed from the medication list:    Mobic 7.5 Mg Tabs (Meloxicam) ..... One daily for 7 dyas then as needed (don't take with any other goody or advil) His updated medication list for this problem includes:    Advil 200 Mg Tabs (Ibuprofen) ..... Otc as directed.    Goodys Body Pain 500-325 Mg Pack (Aspirin-acetaminophen) ..... Otc as directed.  Complete Medication List: 1)  Lansoprazole 30 Mg Cpdr (Lansoprazole) .Marland Kitchen.. 1 capsule by mouth once daily 2)  Multivitamins Tabs (Multiple vitamin) .... One daily 3)  Vitamin B Complex-c Caps (B complex-c) .... One daily 4)  Calcium Carbonate-vitamin D 600-400 Mg-unit Tabs (Calcium carbonate-vitamin d) .... One daily 5)  Isochrome Otc  .... Otc as directed. 6)  Opc3  .... Otc as directed. 7)  Advil 200 Mg Tabs (Ibuprofen) .... Otc as directed. 8)  Goodys Body Pain 500-325 Mg Pack (Aspirin-acetaminophen) .... Otc as directed. 9)  Epipen 0.3 Mg/0.84ml Devi (Epinephrine) .... Inject one time as directed as needed allergic  reaction 10)  Flexeril 10 Mg Tabs (Cyclobenzaprine hcl) .... One by mouth two times a day x 7 dyas then as needed muscle spasm  Patient Instructions: 1)  if headaches become more frequent -- like once per week- let me know  2)  keep drinking lots of water  3)  if you want a lead test -- can do here or at the health dept  4)  work on healthy diet and exercise  5)  please do stool card for colon cancer screening    Orders Added: 1)  Est. Patient 40-64 years [99396] 2)  Est. Patient Level II [16109]   Immunization History:  Tetanus/Td Immunization History:    Tetanus/Td:  td (04/16/2004)  Influenza Immunization History:    Influenza:  fluvax 3+ (02/11/2010)   Immunization History:  Tetanus/Td Immunization History:    Tetanus/Td:  Td (04/16/2004)  Influenza Immunization History:    Influenza:  Fluvax 3+ (02/11/2010)  Current Allergies (reviewed today): No known allergies

## 2010-06-23 DIAGNOSIS — K921 Melena: Secondary | ICD-10-CM | POA: Insufficient documentation

## 2010-07-14 ENCOUNTER — Ambulatory Visit (AMBULATORY_SURGERY_CENTER): Payer: PRIVATE HEALTH INSURANCE | Admitting: *Deleted

## 2010-07-14 VITALS — Ht 73.0 in | Wt 209.0 lb

## 2010-07-14 DIAGNOSIS — K921 Melena: Secondary | ICD-10-CM

## 2010-07-14 MED ORDER — PEG-KCL-NACL-NASULF-NA ASC-C 100 G PO SOLR: 1.0000 | Freq: Once | ORAL | Status: AC

## 2010-07-18 ENCOUNTER — Encounter: Payer: Self-pay | Admitting: Family Medicine

## 2010-07-19 ENCOUNTER — Encounter: Payer: Self-pay | Admitting: Family Medicine

## 2010-07-19 ENCOUNTER — Ambulatory Visit (INDEPENDENT_AMBULATORY_CARE_PROVIDER_SITE_OTHER): Payer: PRIVATE HEALTH INSURANCE | Admitting: Family Medicine

## 2010-07-19 DIAGNOSIS — R059 Cough, unspecified: Secondary | ICD-10-CM

## 2010-07-19 DIAGNOSIS — R05 Cough: Secondary | ICD-10-CM | POA: Insufficient documentation

## 2010-07-19 NOTE — Progress Notes (Signed)
3.5 weeks ago he had a head cold, went "down in my chest".  Doing a lot better but the cough is still there.  Episodic.  Occ tickle in his throat.  Was taking claritin prev.  Cough would inc if he had to talk.  Was out of town for 2 weeks with work; today was the first time he could come in.    Everything is better except for the cough, except for occ post nasal gtt that responds to claritin.  Minimal sputum, clear.  No FCNAVD.  Distant smoker.    Cough is improved a lot over the last 2-3 days.  Meds, vitals, and allergies reviewed.   ROS: See HPI.  Otherwise, noncontributory.  GEN: nad, alert and oriented HEENT: mucous membranes moist, tm wnl, nasal and oral exam wnl NECK: supple w/o LA CV: rrr.  no murmur PULM: ctab, no inc wob ABD: soft, +bs EXT: no edema SKIN: no acute rash

## 2010-07-19 NOTE — Patient Instructions (Signed)
Take care and let me know if the cough doesn't totally go away.  You can take claritin 10mg  a day for the runny nose if needed.

## 2010-07-19 NOTE — Assessment & Plan Note (Addendum)
This is likely post viral and should finally resolve in the next few days.  Normal exam and okay for outpatient fu.   D/w pt and he understood/agreed.  Fu prn.

## 2010-07-20 ENCOUNTER — Encounter: Payer: Self-pay | Admitting: Family Medicine

## 2010-07-28 ENCOUNTER — Encounter: Payer: Self-pay | Admitting: Internal Medicine

## 2010-07-28 ENCOUNTER — Ambulatory Visit (AMBULATORY_SURGERY_CENTER): Payer: PRIVATE HEALTH INSURANCE | Admitting: Internal Medicine

## 2010-07-28 VITALS — BP 155/90 | HR 72 | Temp 97.0°F | Resp 16 | Ht 71.0 in | Wt 209.0 lb

## 2010-07-28 DIAGNOSIS — K921 Melena: Secondary | ICD-10-CM

## 2010-07-28 DIAGNOSIS — R195 Other fecal abnormalities: Secondary | ICD-10-CM

## 2010-07-28 DIAGNOSIS — K5730 Diverticulosis of large intestine without perforation or abscess without bleeding: Secondary | ICD-10-CM

## 2010-07-28 DIAGNOSIS — D126 Benign neoplasm of colon, unspecified: Secondary | ICD-10-CM

## 2010-07-28 DIAGNOSIS — Z1211 Encounter for screening for malignant neoplasm of colon: Secondary | ICD-10-CM

## 2010-07-28 MED ORDER — SODIUM CHLORIDE 0.9 % IV SOLN: 500.0000 mL | INTRAVENOUS | Status: DC

## 2010-07-28 NOTE — Progress Notes (Signed)
  Subjective:    Patient ID: Erik [name], male    DOB: [birth year], 56 y.o.   MRN: [id number]  HPI    Review of Systems     Objective:   Physical Exam        Assessment & Plan:  Diverticulosis per MD.

## 2010-07-28 NOTE — Patient Instructions (Signed)
    Follow discharge instructions.  Next colonoscopy dependent on pathology findings.

## 2010-07-31 ENCOUNTER — Telehealth: Payer: Self-pay | Admitting: *Deleted

## 2010-07-31 NOTE — Telephone Encounter (Signed)

## 2010-08-03 ENCOUNTER — Telehealth: Payer: Self-pay

## 2010-08-03 NOTE — Telephone Encounter (Signed)
Patient notified as instructed by telephone stool card was negative. Health maintenance updated.

## 2010-08-29 NOTE — Assessment & Plan Note (Signed)
[facility]                         GASTROENTEROLOGY OFFICE NOTE   NAME:[place], Erik [name]                       MRN:          [id number]  DATE:06/04/2007                            DOB:          March 12, 1956    HISTORY:  [name] presents today for followup.  He is a 55 year old  with a history of gastroesophageal reflux disease, complicated by  esophagitis and peptic stricture for which he has required esophageal  dilation.  He also has a history of choledocholithiasis and is status  post ERCP with stone extraction, as well as laparoscopic  cholecystectomy.  He presents today for routine followup regarding his  reflux.  He has been maintained on Prevacid 30 mg daily.  On the  medication, he is asymptomatic.  Off medication, significant symptoms.  He has had no recurrent dysphagia since his esophageal dilation in 2001.  This morning, we again discussed colon cancer screening.  At this point,  due to cost-related issues, it is his preference to defer proceeding.  No other complaints or issues.  No lower GI complaints.   ALLERGIES:  He has no known drug allergies.   CURRENT MEDICATIONS INCLUDE:  Prevacid, multivitamin, calcium, B12.   PHYSICAL EXAM:  Well-appearing male, in no acute distress.  Blood pressure 116/70, heart rate is 88, weight is 210 pounds.  HEENT:  Sclerae are anicteric.  LUNGS:  Clear.  HEART:  Regular.  ABDOMEN:  Soft without tenderness, mass or hernia.   IMPRESSION:  1. Gastroesophageal reflux disease, complicated by peptic stricture.      Patient remains asymptomatic, post-dilation, on Prevacid.  2. History of choledocholithiasis, status post ERCP with      sphincterotomy and stone extraction, as well as laparoscopic      cholecystectomy.  3. Colon cancer screening.  Baseline risk.  Again discussed, as      outlined above.   RECOMMENDATIONS:  1. Refill Prevacid 30 mg daily.  An electronic prescription has been      forwarded  with multiple refills.  2. Continued adherence to reflux precautions.  3. Colon cancer screening when the patient decides.     Wilhemina Bonito. Marina Goodell, MD  Electronically Signed    JNP/MedQ  DD: 06/04/2007  DT: 06/04/2007  Job #: [id number]

## 2010-09-01 NOTE — Consult Note (Signed)
Erik [name], Erik [name]                ACCOUNT NO.:  [id number]   MEDICAL RECORD NO.:  [id number]          PATIENT TYPE:  INP   LOCATION:  5733                         FACILITY:  [facility]   PHYSICIAN:  Erik Bales. Ezzard Standing, M.D.  DATE OF BIRTH:  [birth year]   DATE OF CONSULTATION:  04/11/2006  DATE OF DISCHARGE:                                 CONSULTATION   REASON FOR CONSULTATION:  Choledocholithiasis.   HISTORY OF PRESENT ILLNESS:  Erik [name] is a 55 year old white male who is  a patient of Erik A. Tower, M.D. and has had some gastroesophageal  reflux disease.  He has seen Erik Bonito. Erik [name], M.D. and underwent his last  upper endoscopy about 2002.  In fact, he is supposed to see Dr. Marina [name]  within the next several months for plan maybe repeat upper endoscopy and  possible colonoscopy.   He was in the movie theatre yesterday, on April 10, 2006, and he was  eating popcorn when he had sudden onset of pain, vomiting.  He went to  the emergency room where he had an ultrasound of his gallbladder  that  showed gallstones.  He also underwent a hepatobiliary scan which showed  no biliary excretion consistent either with acute common duct  obstruction or hepatobiliary dysfunction.   His labs showed a bilirubin of 2.2, alkaline phosphatase 134, SGOT 250,  SGPT 150, lipase 28.   It is thought that he has a probable common bile duct stone.  He is  being admitted by [facility] Erik [name], M.D. of the [facility].  Erik [name] consulted from GI with planned ERCP tomorrow.   ALLERGIES:  No known drug allergies.   MEDICATIONS:  Prevacid 20 mg daily.   REVIEW OF SYSTEMS:  NEUROLOGY:  No seizure or loss of consciousness.  CARDIAC:  No heart disease or chest pain.  PULMONARY:  No pneumonia or  tuberculosis.  GASTROINTESTINAL:  See history of present illness.  He has had no prior  abdominal surgery.  GENITOURINARY:  No kidney stones or kidney infections.  MUSCULOSKELETAL:  He had right  knee surgery by Erik [name], M.D. in  [id number].  He has done reasonably well from back.  Then he had back surgery  by Erik Lynch. [name], M.D. in 2004 and has done pretty well from that.   SOCIAL HISTORY:  He works as a Theatre manager for [employer].   PHYSICAL EXAMINATION:  VITAL SIGNS:  Temperature 98.1, pulse 83, blood  pressure 116/79, saturations are 96%.  Weight is 205 pounds.  GENERAL:  He is a well-nourished, pleasant, white male, alert and  cooperative on physical examination.  HEENT:  Unremarkable.  NECK:  Supple without masses or thyromegaly.  LUNGS:  Clear to auscultation.  HEART:  Regular rate and rhythm.  I hear no murmur or rub.  ABDOMEN:  Benign without tenderness, guarding, or rebound.  EXTREMITIES:  He has good strength in all four extremities.  NEUROLOGY:  Grossly intact.   LABORATORY DATA:  White blood cell count 9200, hemoglobin 18.3,  hematocrit 53.7, platelet count 146,000, 88% neutrophils. Sodium  139,  potassium 3.6, chloride 103, CO2 25.  Total bilirubin 2.2, alkaline  phosphatase 134, SGOT 250, SGPT 150, lipase 28.   IMPRESSION:  1. Probable choledocholithiasis.  Planned ERCP tomorrow by Dr. Arlyce [name].  2. Cholelithiasis.  After the ERCP, we may be able to do a laparoscopic cholecystectomy  during this hospitalization, but I told him this would be depend on the  weekend surgical schedule and whether he would be best served by being  kept in the hospital and proceeding with laparoscopic cholecystectomy or  being discharged home and having elective laparoscopic cholecystectomy  in the next couple of weeks.  I have discussed with him the indications and potential complications of  gallbladder surgery.  The potential complications include, but are not  limited to, bleeding, infection, bile duct injury, and the possibility  of open surgery.  1. Polycythemia with an elevated hemoglobin of 18.3.  He does not      smoke cigarettes.  2. Mild  thrombocytopenia with a platelet count of 146,000.  3. Gastroesophageal reflux disease.      Erik Bales. Ezzard Standing, M.D.  Electronically Signed     DHN/MEDQ  D:  04/11/2006  T:  04/11/2006  Job:  [id number]   cc:   Erik A. Milinda Antis, [name]  Erik Bonito. Erik Goodell, [name]  Erik [name]  Erik [name]

## 2010-09-01 NOTE — Consult Note (Signed)
NAME:  Erik [name], Erik [name]                          ACCOUNT NO.:  [id number]   MEDICAL RECORD NO.:  [id number]                   PATIENT TYPE:  INP   LOCATION:  0444                                 [facility]ACILITY[facility]   PHYSICIAN:  Learta Codding, M.D. [facility]             DATE OF BIRTH:  09/19/1955   DATE OF CONSULTATION:  08/03/2002  DATE OF DISCHARGE:                                   CONSULTATION   REASON FOR CONSULTATION:  Evaluation for endocarditis.   HISTORY OF PRESENT ILLNESS:  The patient is a 55 year old white male with no  prior history of heart disease.  The patient reports no prior history of  rheumatic heart fever or murmur.  He was admitted on July 27, 2002 when he  presented with extreme low back pain as well as pain radiating into his  right leg.  The patient also at that time presented with nausea and  vomiting.  Throughout his hospital course the patient has been febrile.  Blood cultures were obtained, were positive for Staphylococcus aureus.  It  is presumed the patient has a possible diskitis (possible osteomyelitis)  versus epidural abscess.  He has multiple sites of infections including a  urinary tract infection which is Staphylococcus aureus.  The patient is  being followed by infectious disease and decision has been made to proceed  with back surgery for debridement and evaluation of possible primary source  of Staphylococcus infection.  A systolic murmur was heard and consultation  was requested for the suspicion of endocarditis.  The patient is being seen  by me in the.  The patient is being evaluated by me in the preoperative area  and is being ready to go to surgery.  Continues to have fever.  See reports.  However, no nausea or vomiting.  He reports no headache.  He denies any  prior history of heart disease.  Denies substernal chest pain or shortness  of breath on exertion prior to this hospitalization.  He also denies any  palpitations or syncope.   ALLERGIES:   No known drug allergies.   MEDICATIONS:  In hospital on Ancef and several p.r.n. medications including  narcotics.   PAST MEDICAL HISTORY:  1. History of GERD.  2. History of sinusitis.  3. History of right knee arthroscopy 10 years ago.   REVIEW OF SYSTEMS:  As per HPI.  Currently, no nausea or vomiting.  Decreased appetite.  No dysuria, frequency.  No abdominal pain.  Positive  for fever and chills.   PHYSICAL EXAMINATION:  VITAL SIGNS:  Blood pressure 125/75, heart rate 80-90  beats per minute, respirations 20.  GENERAL:  Well-nourished white male in no distress.  HEENT:  Pupils __________ clear.  NECK:  Supple.  No carotid upstroke.  No carotid bruits.  LUNGS:  Clear.  HEART:  Regular rate and rhythm.  Normal S1, S2.  There is soft systolic  murmur at the left upper sternal border.  There is a positive fourth heart  sound but there are no other pathological murmurs.  ABDOMEN:  Soft, nontender.  There is no rebound or guarding.  Good bowel  sounds.  EXTREMITIES:  Peripheral pulses are palpable.  No clubbing, cyanosis, edema.   LABORATORIES:  White count 8.5, hemoglobin 11.7, hematocrit 33.9, platelet  count 267,000.  Sodium 136, potassium 3.5, chloride 103, CO2 29, glucose  146, BUN 11, creatinine 1.1, calcium 7.9.  Urine was positive for leukocyte  esterase and many bacteria.  2-D echocardiographic study:  Normal LV  systolic function.  No segmental wall motion abnormality.  No significant  valvular heart disease.  No pathological regurgitant lesions.  12-lead  electrocardiogram demonstrates normal sinus rhythm with no acute ischemic  changes.   IMPRESSION AND PLAN:  1. Rule out endocarditis.  No definite clinical stigmata for endocarditis at     the present time, but is certainly a concern.  I personally reviewed the     echocardiographic study and this was a technically adequate study.  There     was no evidence of pathological valvular lesion and particularly no      vegetations were seen.  However, transthoracic echocardiography is not     100% sensitive for small vegetations and if the patient has persistent     fevers after surgery, certainly a transesophageal echocardiographic study     should be further pursued.  We will follow the patient closely     postoperatively and discuss with infectious disease on timing of a     positive pulmonary embolism if indicated.  In the interim, however, the     patient can proceed with surgery as diskitis/epidural abscess may be     primary site of infection and this will need further management including     debridement.  2. No history of rheumatic heart disease or predisposing valvular lesions.     No prior history of cardiac murmur.   DISPOSITION:  Will continue to follow the patient postoperatively and assess  the need for TEE if indicated.                                               Learta Codding, M.D. [facility]    GED/MEDQ  D:  08/03/2002  T:  08/03/2002  Job:  [id number]

## 2010-09-01 NOTE — H&P (Signed)
[name], Erik                          ACCOUNT NO.:  [id number]   MEDICAL RECORD NO.:  [id number]                   PATIENT TYPE:  INP   LOCATION:  0444                                 FACILITY:  [facility]   PHYSICIAN:  Ebbie Ridge. Paitsel, P.A.               DATE OF BIRTH:  Mar 27, 1956   DATE OF ADMISSION:  07/27/2002  DATE OF DISCHARGE:                                HISTORY & PHYSICAL   HISTORY OF PRESENT ILLNESS:  The patient presented to our office today with  extreme low back pain. The pain is radiating into his right leg. It radiates  into his left leg but the right leg is much worse. The pain started  approximately 1 weeks ago. He was seen in  our office on Tuesday, July 21, 2002. An MRI was scheduled at that time. The patient has had such severe  pain that he had to go to the emergency department Saturday night for severe  pain. He has been having  nausea and vomiting, unsure whether it is coming  from the pain or whether he is having  symptoms from his narcotic pain  medication.   We spoke with the patient this morning over the phone and arranged to have  his MRI on Wednesday. The patient simply cannot wait, he is in such severe  pain. He is going to be admitted to [facility] for pain control  and a stat MRI. The patient's primary care physician is Dr. Milinda Antis with  [facility].   ALLERGIES:  None known.   CURRENT MEDICATIONS:  1. Prevacid 30 mg daily.  2. Clarinex p.r.n.   PAST MEDICAL HISTORY:  1. Gastroesophageal reflux disease.  2. Allergies.   PAST SURGICAL HISTORY:  Right knee arthroscopy approximately 10 years ago.   REVIEW OF SYSTEMS:  GENERAL:  Denies weight change, fever or chills or  fatigue.  HEENT:  Denies headaches, tinnitus, visual changes, hearing loss.  CARDIOVASCULAR:  Denies chest pain, palpitations, shortness of breath or  orthopnea. PULMONARY:  Denies dyspnea, wheezing, cough, sputum production or  hemoptysis. GI:  Denies  dysphagia. The patient does have nausea. No vomiting  or abdominal pain. GU:  Denies dysuria, frequency, urgency, hematuria.  ENDOCRINE: Denies polyuria, polydipsia, appetite change, heat or cold  intolerance. MUSCULOSKELETAL:  The patient has extreme back pain radiating  into his right and left lower extremity, the right lower extremity is worse.  SKIN:  No itching, rashes, masses or moles.   PHYSICAL EXAMINATION:  VITAL SIGNS:  Afebrile, pulse 110, respirations 20,  blood pressure 120/80 right arm sitting.  GENERAL:  A 55 year old male, in moderate distress.  HEENT:  PERRL, extraocular movements intact. Pharynx clear. Tympanic  membranes intact.  NECK:  Supple without masses, no carotid bruits detected.  CHEST:  Clear to auscultation bilaterally. No rales, rhonchi or wheezes  noted.  HEART:  Regular rate and rhythm without  murmurs, rubs, gallops.  ABDOMEN:  Positive bowel sounds, soft, nontender, no organomegaly or  abnormal masses.  BACK:  The patient has very limited range of motion of  his back. He has  positive straight leg raising on the right. He has weakness in his right  lower extremity. His sensation it intact.  SKIN:  Warm and dry.   LABORATORY DATA:  His MRI is pending.   PLAN:  The patient is to be admitted to [facility] for pain  control and a stat  MRI.                                               Ebbie Ridge. Paitsel, P.A.    LKP/MEDQ  D:  07/27/2002  T:  07/27/2002  Job:  [id number]

## 2010-09-01 NOTE — Op Note (Signed)
NAMERENO, CLASBY                ACCOUNT NO.:  [id number]   MEDICAL RECORD NO.:  [id number]          PATIENT TYPE:  INP   LOCATION:  5733                         FACILITY:  [facility]   PHYSICIAN:  Thornton Park. Daphine Deutscher, MD  DATE OF BIRTH:  16-Jan-1956   DATE OF PROCEDURE:  04/13/2006  DATE OF DISCHARGE:                               OPERATIVE REPORT   PREOPERATIVE DIAGNOSIS:  Cholecystitis, choledocholithiasis.   POSTOPERATIVE DIAGNOSIS:  Cholecystitis, choledocholithiasis.   PROCEDURE:  Laparoscopic cholecystectomy, interoperative cholangiogram.   SURGEON:  Thornton Park. Daphine Deutscher, M.D.   ASSISTANT:  Currie Paris, M.D.   ANESTHESIA:  General endotracheal.   DRAINS:  One 4 Blake in the right upper quadrant.   DESCRIPTION OF PROCEDURE:  Erik [name] was taken to OR number 16 on  Saturday morning, April 13, 2006, and given general anesthesia.  The  abdomen was prepped with Betadine and draped sterilely.  A longitudinal  incision was made in the umbilicus through which a Hassan cannula was  inserted.  The abdomen was insufflated.  Three trocars were placed in  the upper abdomen.  The gallbladder was seen and appeared distended and  edematous.  It was grasped, elevated, and dissected free of Calot's  triangle.  I had the ERCP picture up on the board.  I dissected out  Calot's triangle and then dissected out the cystic duct.  I applied a  clip upon the gallbladder, incised the cystic duct, and put a clip on  the cystic artery.  I then inserted the Reddick catheter.  I did a  dynamic cholangiogram which showed possibly some filling defects in the  mid portion of the common duct.  It did eventually flow into the  duodenum.  I then flushed the common duct with 10 mL of saline and then  did a repeat cholangiogram which, again, showed flow into the duodenum.  I then triply clipped the cystic duct, triply clipped the cystic artery,  and divided both, and removed the gallbladder without  entering it using  hook electrocautery.  I went ahead and placed a 19 Blake drain in the  subhepatic space in case there were problems with retained stones and  pressure on the cystic duct stump.  The wounds were injected with  Marcaine.  The umbilical defect was repaired with 0 Vicryls and we  continued to use the 30 degree scope to visualize the closure and make  sure it was okay.  I then deflated the abdomen and closed the skin with  4-0 Vicryl.  The patient seemed to tolerate the procedure well and was  taken to the recovery room in satisfactory addition.      Thornton Park Daphine Deutscher, MD  Electronically Signed     MBM/MEDQ  D:  04/13/2006  T:  04/13/2006  Job:  [id number]   cc:   Barbette Hair. Arlyce Dice, MD,FACG  Raenette Rover Felicity Coyer, MD

## 2010-09-01 NOTE — Discharge Summary (Signed)
Erik [name]                          ACCOUNT NO.:  [id number]   MEDICAL RECORD NO.:  [id number]                   PATIENT TYPE:  INP   LOCATION:  0444                                 FACILITY[facility]  [facility]   PHYSICIAN:  Georges Lynch. Darrelyn Hillock, M.D.             DATE OF BIRTH:  02-06-1956   DATE OF ADMISSION:  07/27/2002  DATE OF DISCHARGE:  08/10/2002                                 DISCHARGE SUMMARY   ADMISSION DIAGNOSES:  1. Back pain.  2. Gastroesophageal reflux disease.   DISCHARGE DIAGNOSES:  1. Status osteomyelitis.  2. Positive blood culture, Staphylococcus aureus.  3. Urinary tract infection.   PROCEDURE:  The patient was admitted to [facility] and taken to  the operating room on August 03, 2002, to undergo hemilaminectomy bilateral  L5-S1, microdiskectomy L5-S1.  Surgeon:  Dr. Ranee Gosselin.  Assistant:  Dr. Ronnell Guadalajara.  Surgery was performed under general anesthesia.  A  Hemovac drain was placed at the time of surgery.   HOSPITAL COURSE:  This is a 55 year old male who was admitted to [facility] for extreme low back pain.  He was having such severe pain he was  admitted for pain control with an MRI scheduled.  The patient's MRI revealed  a large herniated disk at L5-S1, right greater than left, affecting the S1  nerve root.  The patient was admitted for pain control with surgery planned.  The patient developed a preoperative fever.  It was found that he had a  urinary tract infection.  Surgery was prolonged until that could be treated.  The patient also developed sepsis with positive blood cultures.  It was felt  that he should be ruled out for endocarditis.  A cardiology consult was  called, and endocarditis was ruled out.  The patient was very slow to  progress.  He was followed by the hospitalist as well as the cardiologist.  The patient's MRI was repeated to rule out disk abscess.  His MRI did not  define a definite liquid abscess, and he  was followed.  The patient became  afebrile after antibiotic therapy and was discharged home on August 10, 2002.   CONSULTATIONS:  1. Cardiologist, Dr. Andee Lineman.  2. [facility] hospitalist, Dr. Nolon Rod Monguilod.   LABORATORY DATA:  Preadmission CBC:  WBC 11.4, RBC 5.37, hemoglobin 15.6,  hematocrit 45.2, platelet count 153.  Chemistries normal except elevated  glucose of 172.  The C-reactive protein was high at 45.2.  Urinalysis  revealed positive nitrite, protein 100.  He had 3-6 RBC's, 0-2 wbc's, many  bacteria.  His blood culture grew out Staphylococcus aureus.  Urine culture  also grew out Staphylococcus aureus.  Wound culture from the disk space L5-  S1 revealed no wbc's, no organisms seen on Gram's stain.  Wound culture  eventually grew out Staphylococcus aureus as well.   The patient also had an echocardiogram which did  not reveal endocarditis.   The patient had a GVT to rule out blood clot in his left lower extremity  which was negative.   The patient's preoperative EKG was sinus tachycardia with a rate of 120.   The patient's preadmission chest x-ray showed bronchitic changes.  No active  disease.   The patient's repeat MRI on August 10, 2002, revealed diskitis,  osteomyelitis.   DISPOSITION:  The patient was discharged on August 10, 2002.   CONDITION ON DISCHARGE:  Improved.   DISCHARGE MEDICATIONS:  1. Percocet 10/650, 1 or 2 every four to six hours as needed for pain.  2. Robaxin 500 mg, 1 every six hours as needed for muscle spasm.  3. Ancef 2 g IV q.8h.   FOLLOW-UP:  The patient is scheduled to follow up with Dr. Darrelyn Hillock in the  office in two weeks.   DISCHARGE INSTRUCTIONS:  [facility] will administer his IV Ancef.  To keep his wound dry and clean.     Ebbie Ridge. Paitsel, P.A.                     Ronald A. Darrelyn Hillock, M.D.    Tilden Dome  D:  09/04/2002  T:  09/05/2002  Job:  [id number]

## 2010-09-01 NOTE — Op Note (Signed)
Erik [name], Erik [name]                          ACCOUNT NO.:  [id number]   MEDICAL RECORD NO.:  [id number]                   PATIENT TYPE:  INP   LOCATION:  0444                                 FACILITY:  [facility]   PHYSICIAN:  Georges Lynch. Gioffre, M.D.             DATE OF BIRTH:  1956-02-10   DATE OF PROCEDURE:  08/03/2002  DATE OF DISCHARGE:                                 OPERATIVE REPORT   PREOPERATIVE DIAGNOSIS:  Previous herniated disk at L5-S1.  For some reason,  preoperatively he developed a Staphylococcus septicemia with Staphylococcus  in his bladder.  He had a bladder infection and he seeded bacteria to his  disk space.   POSTOPERATIVE DIAGNOSIS:  Previous herniated disk at L5-S1.  For some  reason, preoperatively he developed a Staphylococcus septicemia with  Staphylococcus in his bladder.  He had a bladder infection and he seeded  bacteria to his disk space.   PROCEDURE:  1. Hemilaminectomy bilaterally at L5-S1.  2. Drainage of an intradiskal disk space infection at L5-S1.  3. Microdiskectomy at L5-S1 bilaterally.   SURGEON:  Georges Lynch. Darrelyn Hillock, M.D.   ASSISTANT:  Sharolyn Douglas, M.D.   INDICATIONS:  Previous herniated disk at L5-S1.  For some reason,  preoperatively he developed a Staphylococcus septicemia with Staphylococcus  in his bladder.  He had a bladder infection and he seeded bacteria to his  disk space.  We had started him on antibiotic treatment immediately.  He  also had a disk space biopsy last week and because of the weakness that he  began to develop in his left foot that he did not have when he came in we  decided to go ahead and do an open biopsy of his disk space and a  microdiskectomy.   DESCRIPTION OF PROCEDURE:  With the patient on the operating table in the  prone position on the spinal frame, a routine orthopedic prepping and  draping of his back was carried out.  Two needles were placed in the back  for localization purposes and an x-ray was taken.   Following this, we then  went on and made an incision over the L5-S1 interspaces, went down and  separated the muscles of the lamina and spinous process bilaterally.  X-ray  was taken again to verify our position.  We then carried out  hemilaminectomies bilaterally and then brought the microscope in and removed  the ligament of flavum bilaterally at L5-S1.  Immediately upon going down  into the disk space of L5-S1 on the left we noted that he had purulent  material that extruded from the disk and migrated proximally.  We then made  an incision into the disk space and went up subligamentous and did a  complete drainage of the purulent material.  We went down into the disk  space on the left, and there was absolutely no disk there.  The disk was all  necrotic.  So we then went on to the opposite right side and noticed large  herniated disk material pieces that we removed from the intradisk space.  There was no purulent material on the right in the disk space.  So  basically, after we completed the diskectomy we went back on the left side  again to make sure there were no pieces of disk migrated over to the left.  We were able to easily expose the nerve root.  We had good visualization.  We went subligametous as well.  Then we injected the disk space with  antibiotic solution and thoroughly flushed it out and then made sure that  there were no other loose fragments noted.  We then had good hemostasis with  the bipolar.  I then inserted a Hemovac drain and loosely approximated the  wound in the usual fashion, and sterile dressings were  applied.  Cultures were sent for culture and sensitivity for aerobic and  anaerobic as well as a stat Gram stain and also we sent the disk material  down for cultures as well.  The patient was on Ancef antibiotic prior to  surgery.  Of note, we took the cultures before we did any irrigation with  the antibiotic solution.                                                Ronald A. Darrelyn Hillock, M.D.    RAG/MEDQ  D:  08/03/2002  T:  08/04/2002  Job:  [id number]   cc:   Rockey Situ. Flavia Shipper., M.D.  [address]  [place]  [place] [address]  Fax: [phone]

## 2010-09-01 NOTE — Discharge Summary (Signed)
NAMEANTONIS, LOR                ACCOUNT NO.:  [id number]   MEDICAL RECORD NO.:  [id number]          PATIENT TYPE:  INP   LOCATION:  5711                         FACILITY:  [facility]   PHYSICIAN:  Thornton Park. Daphine Deutscher, MD  DATE OF BIRTH:  February 22, 1956   DATE OF ADMISSION:  04/11/2006  DATE OF DISCHARGE:  04/15/2006                               DISCHARGE SUMMARY   ADMISSION DIAGNOSIS:  Choledocholithiasis and gallstones.   HOSPITAL COURSE:  This patient was admitted to Dr. Rene Paci and  was seen by Dr. Ezzard Standing in [facility].  He underwent an ERCP on April 12, 2006, and a tiny stone was removed following by sphincterotomy.  He  went to the OR for a lap chole with intraoperative cholangiogram on  April 13, 2006, that showed chronic cholecystitis and then got better  and was ready for discharge on April 15, 2006.  He had a JP draining  moderate to slight fluid, and this was left in to be removed in the  office.  He otherwise said he was recovering well.  Followup in [facility]  office in 3-4 days.   CONDITION ON DISCHARGE:  Improved.      Thornton Park Daphine Deutscher, MD  Electronically Signed     MBM/MEDQ  D:  05/15/2006  T:  05/16/2006  Job:  [id number]

## 2010-09-01 NOTE — Assessment & Plan Note (Signed)
[facility]                         GASTROENTEROLOGY OFFICE NOTE   NAME:Sandoval, KEYMARION BEARMAN                       MRN:          [id number]  DATE:05/17/2006                            DOB:          [birth year]    HISTORY:  Mr. Gentile presents today for followup.  He is a 55 year old  gentleman with a history of gastroesophageal reflux disease complicated  by peptic stricture for which he has undergone prior esophageal  dilation.  For his reflux disease he has been maintained on Prevacid  long term.  On the medication he does well without heartburn or  indigestion.  Remarkably, he has had no recurrent dysphagia since his  esophageal dilation in 2001.  His only interval problem since his office  visit last year was that of acute cholecystitis for which he underwent  cholecystectomy in late December of 2007.  He also underwent ERCP for  common duct stone.  He has recovered fully.  He also acknowledges that  he continues to consider moving forward with screening colonoscopy.  He  denies abdominal pain, change in bowel habits or bleeding.   MEDICATIONS:  1. Prevacid 30 mg daily.  2. Multivitamin.  3. Calcium.  4. B12.  5. Topical steroid cream.   PHYSICAL EXAM:  A well appearing male in no acute distress.  Blood  pressure is 140/90, heart rate is 80, weight is 203.6 pounds.  HEENT:  Sclera are anicteric.  LUNGS:  Clear.  HEART:  Regular.  ABDOMEN:  Soft, without tenderness, mass or hernia.  Surgical wound  sites are well healed.   IMPRESSION:  1. Gastroesophageal reflux disease complicated by peptic stricture.      Patient remains asymptomatic post dilation on Prevacid.  2. Recent symptomatic cholelithiasis status post laparoscopic      cholecystectomy as well as endoscopic retrograde      cholangiopancreatography with sphincterotomy and stone extraction.      Clinically well.  3. Colon cancer screening.  Baseline risk.  Again discussed with  patient screening colonoscopy.   RECOMMENDATION:  1. Refill Prevacid 30 mg daily.  A prescription with multiple refills      has been provided.  2. Continued adherence to reflux precautions.  3. Screening colonoscopy when and if the patient is interested in      proceeding.     Wilhemina Bonito. Marina Goodell, MD  Electronically Signed    JNP/MedQ  DD: 05/17/2006  DT: 05/17/2006  Job #: [id number]

## 2011-06-01 ENCOUNTER — Ambulatory Visit (INDEPENDENT_AMBULATORY_CARE_PROVIDER_SITE_OTHER): Payer: PRIVATE HEALTH INSURANCE | Admitting: Family Medicine

## 2011-06-01 ENCOUNTER — Encounter: Payer: Self-pay | Admitting: Family Medicine

## 2011-06-01 VITALS — BP 138/84 | HR 80 | Temp 98.0°F | Ht 71.0 in | Wt 219.0 lb

## 2011-06-01 DIAGNOSIS — R030 Elevated blood-pressure reading, without diagnosis of hypertension: Secondary | ICD-10-CM

## 2011-06-01 DIAGNOSIS — R0989 Other specified symptoms and signs involving the circulatory and respiratory systems: Secondary | ICD-10-CM

## 2011-06-01 DIAGNOSIS — R0600 Dyspnea, unspecified: Secondary | ICD-10-CM

## 2011-06-01 DIAGNOSIS — R0609 Other forms of dyspnea: Secondary | ICD-10-CM

## 2011-06-01 DIAGNOSIS — R0602 Shortness of breath: Secondary | ICD-10-CM

## 2011-06-01 NOTE — Patient Instructions (Addendum)
We will schedule treadmill stress test at check out  Follow up with me 2-3 weeks after that  Stay active and aim for exercise 5 days per week  For 30 minutes  Also watch salt and fat in diet

## 2011-06-01 NOTE — Progress Notes (Signed)
Subjective:    Patient ID: Erik [name], male    DOB: 15-Oct-1955, 56 y.o.   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 If a lot of salt in his diet  Eats a fair amount of cheese  Usually looses wt in summer   No regular exercise   Lab Results  Component Value Date   CHOL 138 06/05/2010   HDL 31.00* 06/05/2010   LDLCALC 77 06/05/2010   TRIG 152.0* 06/05/2010   CHOLHDL 4 06/05/2010     EKG shows sinus rhythm rate 82 No acute ST/ T changes   Shortness of breath - only if running full out or lifting heavy things - is out of shape  Quit smoking before the 1990s   Father had heart dz with CABG -- thinks he was in his 41s  GF also did  More than 1/2 of the family has high bp   Patient Active Problem List  Diagnoses  . HYPERLIPIDEMIA, MILD  . THROMBOCYTOPENIA  . ELEVATED BLOOD PRESSURE WITHOUT DIAGNOSIS OF HYPERTENSION  . HEADACHE  . HEMOCCULT POSITIVE STOOL  . Cough  . Dyspnea on exertion   Past Medical History  Diagnosis Date  . Chronic headaches   . Allergy     seasonal  . Arthritis   . Cataract     surgery  . GERD (gastroesophageal reflux disease)     with HH  . Labile blood pressure   . Elevated liver enzymes   . Hyperlipidemia   . Eczema    Past Surgical History  Procedure Date  . Cataract extraction w/ intraocular lens  implant, bilateral   . Cholecystectomy 2007  . Upper gastrointestinal endoscopy   . Knee arthroscopy     right  . Back surgery     Ruptured disk with staph infection  . Esophagogastroduodenoscopy 2001    Stricture/GERD and HH  . Abdominal US 2002    Negative  . Cervical disc  surgery     with Staph infection   History  Substance Use Topics  . Smoking status: Former Smoker    Quit date: 04/16/1989  . Smokeless tobacco: Never Used  . Alcohol Use: Yes     Rare, almost none.   Family History  Problem Relation Age of Onset  . Heart disease Father     Heart problems, CABG  . Heart disease Maternal Grandfather     MI   No Known Allergies Current Outpatient Prescriptions on File Prior to Visit  Medication Sig Dispense Refill  . Aspirin-Acetaminophen-Caffeine (GOODY HEADACHE PO) Take 1 packet by mouth as needed. headache       . B Complex Vitamins (VITAMIN B COMPLEX) CAPS Take 1 capsule by mouth daily.        . Calcium-Magnesium-Vitamin D (CITRACAL CALCIUM+D) 600-40-500 MG-MG-UNIT TB24 Take 1 tablet by mouth daily.        . cyclobenzaprine (FLEXERIL) 10 MG tablet Take 10 mg by mouth 2 (two) times daily as needed.        Marland Kitchen EPINEPHrine (  EPIPEN JR) 0.15 MG/0.3ML injection Inject 0.15 mg into the muscle as needed.        Marland Kitchen ibuprofen (ADVIL,MOTRIN) 200 MG tablet Take 200 mg by mouth every 6 (six) hours as needed.        . lansoprazole (PREVACID) 30 MG capsule Take 1 capsule by mouth daily.       . Multiple Vitamin (MULTIVITAMIN) tablet Take 1 tablet by mouth daily.        . naproxen sodium (ANAPROX) 220 MG tablet Take 220 mg by mouth as needed. Takes 2-3 for headache        Current Facility-Administered Medications on File Prior to Visit  Medication Dose Route Frequency Provider Last Rate Last Dose  . 0.9 %  sodium chloride infusion  500 mL Intravenous Continuous Yancey Flemings, MD            Review of Systems Review of Systems  Constitutional: Negative for fever, appetite change, fatigue and unexpected weight change.  Eyes: Negative for pain and visual disturbance.  Respiratory: Negative for cough and pos for sob on exertion/ neg for edema/ PND or orthopnea   Cardiovascular: Negative for cp or palpitations    Gastrointestinal: Negative for nausea, diarrhea and  constipation.  Genitourinary: Negative for urgency and frequency.  Skin: Negative for pallor or rash   Neurological: Negative for weakness, light-headedness, numbness and headaches.  Hematological: Negative for adenopathy. Does not bruise/bleed easily.  Psychiatric/Behavioral: Negative for dysphoric mood. The patient is not nervous/anxious.          Objective:   Physical Exam  Constitutional: He appears well-developed and well-nourished. No distress.       overwt and well appearing   HENT:  Head: Normocephalic and atraumatic.  Mouth/Throat: Oropharynx is clear and moist.  Eyes: Conjunctivae and EOM are normal. Pupils are equal, round, and reactive to light. No scleral icterus.  Neck: Normal range of motion. Neck supple. No JVD present. Carotid bruit is not present. No thyromegaly present.  Cardiovascular: Normal rate, regular rhythm, normal heart sounds and intact distal pulses.  Exam reveals no gallop.   Pulmonary/Chest: Effort normal and breath sounds normal. No respiratory distress. He has no wheezes. He has no rales. He exhibits no tenderness.       No crackles   Abdominal: Soft. Bowel sounds are normal. He exhibits no distension, no abdominal bruit and no mass. There is no tenderness.  Musculoskeletal: Normal range of motion. He exhibits no edema and no tenderness.  Lymphadenopathy:    He has no cervical adenopathy.  Neurological: He is alert. He has normal reflexes. No cranial nerve deficit. He exhibits normal muscle tone. Coordination normal.  Skin: Skin is warm and dry. No rash noted. No erythema. No pallor.  Psychiatric: He has a normal mood and affect.          Assessment & Plan:

## 2011-06-01 NOTE — Assessment & Plan Note (Signed)
This was high again when giving blood but better here Has fam hx Disc imp of wt loss and better health habits-handouts given Will consider med at f/u and labs

## 2011-06-01 NOTE — Assessment & Plan Note (Signed)
Worse recently but also very deconditioned Rev cardiac risk factors today- prior smoker/fam hx / labile bp/ chol Will schedule treadmill stress test  F/u after to disc result Did outline a plan for exercise

## 2011-06-15 ENCOUNTER — Other Ambulatory Visit: Payer: Self-pay | Admitting: Family Medicine

## 2011-07-03 ENCOUNTER — Encounter: Payer: PRIVATE HEALTH INSURANCE | Admitting: Cardiovascular Disease

## 2011-07-20 ENCOUNTER — Ambulatory Visit (INDEPENDENT_AMBULATORY_CARE_PROVIDER_SITE_OTHER): Payer: PRIVATE HEALTH INSURANCE | Admitting: Cardiovascular Disease

## 2011-07-20 VITALS — BP 132/90 | HR 87 | Ht 73.0 in | Wt 220.0 lb

## 2011-07-20 DIAGNOSIS — R0609 Other forms of dyspnea: Secondary | ICD-10-CM

## 2011-07-20 DIAGNOSIS — R0600 Dyspnea, unspecified: Secondary | ICD-10-CM

## 2011-07-20 DIAGNOSIS — R0989 Other specified symptoms and signs involving the circulatory and respiratory systems: Secondary | ICD-10-CM

## 2011-07-20 NOTE — Progress Notes (Signed)
Exercise Treadmill Test   Treadmill ordered for recent epsiodes of shortness of breath  Resting EKG shows NSR with rate of 80 bpm, no significant ST or T wave changes, left axis deviation Resting blood pressure of 132/90 Stand bruce protocal was used.  Patient exercised for 9 minutes 56 seconds He achieved 11.9 METS Peak heart rate of 200 bpm.  This was >100% of the maximum predicted heart rate (target heart rate 164). No symptoms of chest pain or lightheadedness were reported at peak stress or in recovery.  Peak Blood pressure recorded was 192/100 Heart rate at 3 minutes in recovery was 122 bpm. No significant ST changes concerning for ischemia  FINAL IMPRESSION: Normal exercise stress test. No significant EKG changes concerning for ischemia. Excellent exercise tolerance. At peak exercise, running on a treadmill with incline, he did develop short run of SVT with heart rate greater than 200 beats per minute. Speed of the treadmill was decreased at this point and he converted back to sinus tachycardia. I suggested to him that if he notices additional episodes of profound tachycardia with exertion that he may benefit from low-dose beta blocker on a routine basis.

## 2011-07-20 NOTE — Progress Notes (Signed)
Will disc with him at his f/u

## 2011-07-20 NOTE — Patient Instructions (Signed)
Treadmill study is essentially normal. Please increase your regular exercise. Please contact Dr. Milinda Antis or our office if you have worsening shortness of breath, chest tightness or tachycardia.

## 2011-08-01 ENCOUNTER — Ambulatory Visit (INDEPENDENT_AMBULATORY_CARE_PROVIDER_SITE_OTHER): Payer: PRIVATE HEALTH INSURANCE | Admitting: Family Medicine

## 2011-08-01 ENCOUNTER — Encounter: Payer: Self-pay | Admitting: Family Medicine

## 2011-08-01 VITALS — BP 140/90 | HR 75 | Temp 97.6°F | Ht 71.0 in | Wt 219.5 lb

## 2011-08-01 DIAGNOSIS — R0600 Dyspnea, unspecified: Secondary | ICD-10-CM

## 2011-08-01 DIAGNOSIS — R0609 Other forms of dyspnea: Secondary | ICD-10-CM

## 2011-08-01 DIAGNOSIS — E669 Obesity, unspecified: Secondary | ICD-10-CM | POA: Insufficient documentation

## 2011-08-01 DIAGNOSIS — R030 Elevated blood-pressure reading, without diagnosis of hypertension: Secondary | ICD-10-CM

## 2011-08-01 MED ORDER — EPINEPHRINE 0.3 MG/0.3ML IJ DEVI: 0.3000 mg | Freq: Once | INTRAMUSCULAR | Status: DC

## 2011-08-01 NOTE — Assessment & Plan Note (Signed)
Suspect this is from deconditioning  Already imp with some exercise Did rev treadmill stress test - had run of tachycardia at top speed and recovered Will consider beta blocker if that re occurs or becomes symptomatic  Made plan for wt loss/ fitness/ lifestyle change

## 2011-08-01 NOTE — Progress Notes (Signed)
Subjective:    Patient ID: Erik [name], male    DOB: October 17, 1955, 56 y.o.   MRN: [id number]  HPI Pt here for f/u of DOE after treadmill stress test  Results were re assuring He did have a short run of tachycardia mid test with HR over 200 immed imp when he slowed down  Cardiologist mentioned if this occurs regularly to consider beta blocker  He does not feel any episodes of rapid heart rate or hypertension Overall his DOE is improved  Has been working a lot - gets HR up at work - is physical (heavy lifting)   Wt is down 1 lb bmi is 30  bp 140/90 today Resting bp at stress test was 132/90 Started getting back on the treadmill- then got lazy and stopped  Eats too much peanut butter Too much salt Too much junk Portions really vary   Patient Active Problem List  Diagnoses  . HYPERLIPIDEMIA, MILD  . THROMBOCYTOPENIA  . ELEVATED BLOOD PRESSURE WITHOUT DIAGNOSIS OF HYPERTENSION  . HEADACHE  . HEMOCCULT POSITIVE STOOL  . Cough  . Dyspnea on exertion  . Obesity   Past Medical History  Diagnosis Date  . Chronic headaches   . Allergy     seasonal  . Arthritis   . Cataract     surgery  . GERD (gastroesophageal reflux disease)     with HH  . Labile blood pressure   . Elevated liver enzymes   . Hyperlipidemia   . Eczema    Past Surgical History  Procedure Date  . Cataract extraction w/ intraocular lens  implant, bilateral   . Cholecystectomy 2007  . Upper gastrointestinal endoscopy   . Knee arthroscopy     right  . Back surgery     Ruptured disk with staph infection  . Esophagogastroduodenoscopy 2001    Stricture/GERD and HH  . Abdominal US 2002    Negative  . Cervical disc surgery     with Staph infection   History  Substance Use Topics  . Smoking status: Former Smoker    Quit date: 04/16/1989  . Smokeless tobacco: Never Used  . Alcohol Use: Yes     Rare, almost none.   Family History  Problem Relation Age of Onset  . Heart disease Father    Heart problems, CABG  . Heart disease Maternal Grandfather     MI   No Known Allergies Current Outpatient Prescriptions on File Prior to Visit  Medication Sig Dispense Refill  . Aspirin-Acetaminophen-Caffeine (GOODY HEADACHE PO) Take 1 packet by mouth as needed. headache       . B Complex Vitamins (VITAMIN B COMPLEX) CAPS Take 1 capsule by mouth daily.        . Calcium-Magnesium-Vitamin D (CITRACAL CALCIUM+D) 600-40-500 MG-MG-UNIT TB24 Take 1 tablet by mouth daily.        Marland Kitchen ibuprofen (ADVIL,MOTRIN) 200 MG tablet Take 200 mg by mouth every 6 (six) hours as needed.        . lansoprazole (PREVACID) 30 MG capsule take 1 capsule by mouth once daily  30 capsule  9  . Multiple Vitamin (MULTIVITAMIN) tablet Take 1 tablet by mouth daily.        . naproxen sodium (ANAPROX) 220 MG tablet Take 220 mg by mouth as needed. Takes 2-3 for headache       . cyclobenzaprine (FLEXERIL) 10 MG tablet Take 10 mg by mouth 2 (two) times daily as needed.  Current Facility-Administered Medications on File Prior to Visit  Medication Dose Route Frequency Provider Last Rate Last Dose  . 0.9 %  sodium chloride infusion  500 mL Intravenous Continuous Hilarie Fredrickson, MD         Review of Systems Review of Systems  Constitutional: Negative for fever, appetite change, fatigue and unexpected weight change.  Eyes: Negative for pain and visual disturbance.  Respiratory: Negative for cough and pos for mild SOB on exertion that has improved from last visit  Cardiovascular: Negative for cp or palpitations    Gastrointestinal: Negative for nausea, diarrhea and constipation.  Genitourinary: Negative for urgency and frequency.  Skin: Negative for pallor or rash   Neurological: Negative for weakness, light-headedness, numbness and headaches.  Hematological: Negative for adenopathy. Does not bruise/bleed easily.  Psychiatric/Behavioral: Negative for dysphoric mood. The patient is not nervous/anxious.          Objective:     Physical Exam  Constitutional: He appears well-developed and well-nourished. No distress.       Obese and well appearing   HENT:  Head: Normocephalic and atraumatic.  Mouth/Throat: Oropharynx is clear and moist.  Eyes: Conjunctivae and EOM are normal. Pupils are equal, round, and reactive to light. No scleral icterus.  Neck: Normal range of motion. Neck supple. No JVD present. Carotid bruit is not present. Erythema present. No thyromegaly present.  Cardiovascular: Normal rate, regular rhythm, normal heart sounds and intact distal pulses.  Exam reveals no gallop.   No murmur heard. Pulmonary/Chest: Effort normal and breath sounds normal. No respiratory distress. He has no wheezes.       No crackles   Abdominal: Soft. Bowel sounds are normal. He exhibits no distension and no abdominal bruit. There is no tenderness.  Musculoskeletal: He exhibits no edema.  Lymphadenopathy:    He has no cervical adenopathy.  Neurological: He is alert. He has normal reflexes.  Skin: Skin is warm and dry. No pallor.  Psychiatric: He has a normal mood and affect.          Assessment & Plan:

## 2011-08-01 NOTE — Assessment & Plan Note (Signed)
Discussed how this problem influences overall health and the risks it imposes  Reviewed plan for weight loss with lower calorie diet (via better food choices and also portion control or program like weight watchers) and exercise building up to or more than 30 minutes 5 days per week including some aerobic activity    Will both cut out junk/ high calorie foods and also cut portions by at least 1/3

## 2011-08-01 NOTE — Patient Instructions (Addendum)
Start exercise 5 days per week - and gradually work up to 30 minutes  Get rid of junk food  Now start cutting portions by at least 1/3  Drink lots of water Avoid sugar beverages  Goal of weight at 195- over the next year Follow up in 3 months to track progress- if no improvement in blood pressure will discuss medication

## 2011-08-01 NOTE — Assessment & Plan Note (Signed)
Borderline at 140/90 Disc need for wt loss/ fitness- rev treadmill stress test Outlined plan for lifestyle change F/u 3 mo  If not imp consider bp med like beta blocker

## 2011-08-28 ENCOUNTER — Telehealth: Payer: Self-pay | Admitting: Radiology

## 2011-08-28 ENCOUNTER — Ambulatory Visit (INDEPENDENT_AMBULATORY_CARE_PROVIDER_SITE_OTHER): Payer: PRIVATE HEALTH INSURANCE | Admitting: Family Medicine

## 2011-08-28 ENCOUNTER — Encounter: Payer: Self-pay | Admitting: Family Medicine

## 2011-08-28 VITALS — BP 140/90 | HR 114 | Temp 98.9°F | Ht 71.0 in | Wt 216.5 lb

## 2011-08-28 DIAGNOSIS — K529 Noninfective gastroenteritis and colitis, unspecified: Secondary | ICD-10-CM

## 2011-08-28 DIAGNOSIS — R197 Diarrhea, unspecified: Secondary | ICD-10-CM

## 2011-08-28 DIAGNOSIS — D751 Secondary polycythemia: Secondary | ICD-10-CM

## 2011-08-28 DIAGNOSIS — K5289 Other specified noninfective gastroenteritis and colitis: Secondary | ICD-10-CM

## 2011-08-28 LAB — CBC WITH DIFFERENTIAL/PLATELET
Basophils Relative: 0.1 % (ref 0.0–3.0)
Eosinophils Relative: 2.2 % (ref 0.0–5.0)
HCT: 56.3 % — ABNORMAL HIGH (ref 39.0–52.0)
Hemoglobin: 18.6 g/dL (ref 13.0–17.0)
Lymphs Abs: 1.1 10*3/uL (ref 0.7–4.0)
MCV: 88.2 fl (ref 78.0–100.0)
Monocytes Absolute: 0.5 10*3/uL (ref 0.1–1.0)
Monocytes Relative: 4.8 % (ref 3.0–12.0)
Platelets: 155 10*3/uL (ref 150.0–400.0)
RBC: 6.38 Mil/uL — ABNORMAL HIGH (ref 4.22–5.81)
WBC: 11.3 10*3/uL — ABNORMAL HIGH (ref 4.5–10.5)

## 2011-08-28 LAB — BASIC METABOLIC PANEL
BUN: 26 mg/dL — ABNORMAL HIGH (ref 6–23)
CO2: 29 mEq/L (ref 19–32)
Calcium: 9.9 mg/dL (ref 8.4–10.5)
GFR: 66.96 mL/min (ref >60.00)
Glucose, Bld: 140 mg/dL — ABNORMAL HIGH (ref 70–99)
Sodium: 140 mEq/L (ref 135–145)

## 2011-08-28 LAB — HEPATIC FUNCTION PANEL
AST: 30 U/L (ref 0–37)
Albumin: 3.9 g/dL (ref 3.5–5.2)
Alkaline Phosphatase: 97 U/L (ref 39–117)
Total Protein: 7 g/dL (ref 6.0–8.3)

## 2011-08-28 NOTE — Telephone Encounter (Signed)
I looked at labs wbc and hb are high (he has a baseline high hb in the past)- and dehydration could affect numbers  Due to increased wbc however I want to do some stool tests for infection and also repeat the cbc when able Update me with how he is feeling and please tell him to come in for more labs and stool tests -let me know and I will order them, thanks

## 2011-08-28 NOTE — Assessment & Plan Note (Signed)
With virus running through family  Urged to stop any nsaids and continue prevacid  Lab today  BRAT diet/ fluids Update if not starting to improve in a week or if worsening

## 2011-08-28 NOTE — Telephone Encounter (Signed)
[facility] called critical results - HGB 18.6

## 2011-08-28 NOTE — Patient Instructions (Signed)
Stop goody powders - they cause stomach ulcers Stay with BRAT diet until you are feeling better - banana/ rice/ applesauce/toast Labs today  Make sure to drink enough fluids  Update if not starting to improve in a week or if worsening

## 2011-08-28 NOTE — Progress Notes (Signed)
Subjective:    Patient ID: Erik [name], male    DOB: November 28, 1955, 56 y.o.   MRN: [id number]  HPI Here for GI symptoms  Wonders if he has infection 2 weeks ago-started with bloating and sour burps , everything he ate "blew him up" Tried eating yogurt for probiotics- helped Ate steak- got worse , bloating-- several times   Last night - bloating and diarrhea and vomiting - ? Fever- felt warm  Stools pretty watery No blood  Ate yogurt which helped   This am - has a bit of heatburn - due to late taking heartburn  Is also drinking water  Right now feels bloated and hungry   Had colonosc about a year ago with polyps   He eats steak medium (pink in the middle)   Whole abdomen feels swollen and uncomfortable- not painful   He had parasite 20 years ago - after camping   Wife had a GI virus 1-2 weeks ago    Patient Active Problem List  Diagnoses  . HYPERLIPIDEMIA, MILD  . THROMBOCYTOPENIA  . ELEVATED BLOOD PRESSURE WITHOUT DIAGNOSIS OF HYPERTENSION  . HEADACHE  . HEMOCCULT POSITIVE STOOL  . Cough  . Dyspnea on exertion  . Obesity  . Gastroenteritis   Past Medical History  Diagnosis Date  . Chronic headaches   . Allergy     seasonal  . Arthritis   . Cataract     surgery  . GERD (gastroesophageal reflux disease)     with HH  . Labile blood pressure   . Elevated liver enzymes   . Hyperlipidemia   . Eczema    Past Surgical History  Procedure Date  . Cataract extraction w/ intraocular lens  implant, bilateral   . Cholecystectomy 2007  . Upper gastrointestinal endoscopy   . Knee arthroscopy     right  . Back surgery     Ruptured disk with staph infection  . Esophagogastroduodenoscopy 2001    Stricture/GERD and HH  . Abdominal US 2002    Negative  . Cervical disc surgery     with Staph infection   History  Substance Use Topics  . Smoking status: Former Smoker    Quit date: 04/16/1989  . Smokeless tobacco: Never Used  . Alcohol Use: Yes     Rare, almost  none.   Family History  Problem Relation Age of Onset  . Heart disease Father     Heart problems, CABG  . Heart disease Maternal Grandfather     MI   No Known Allergies Current Outpatient Prescriptions on File Prior to Visit  Medication Sig Dispense Refill  . Aspirin-Acetaminophen-Caffeine (GOODY HEADACHE PO) Take 1 packet by mouth as needed. headache       . B Complex Vitamins (VITAMIN B COMPLEX) CAPS Take 1 capsule by mouth daily.        . Calcium-Magnesium-Vitamin D (CITRACAL CALCIUM+D) 600-40-500 MG-MG-UNIT TB24 Take 1 tablet by mouth daily.        . cyclobenzaprine (FLEXERIL) 10 MG tablet Take 10 mg by mouth 2 (two) times daily as needed.        Marland Kitchen EPINEPHrine (EPI-PEN) 0.3 mg/0.3 mL DEVI Inject 0.3 mLs (0.3 mg total) into the muscle once.  1 Device  3  . ibuprofen (ADVIL,MOTRIN) 200 MG tablet Take 200 mg by mouth every 6 (six) hours as needed.        . lansoprazole (PREVACID) 30 MG capsule take 1 capsule by mouth once daily  30 capsule  9  . Multiple Vitamin (MULTIVITAMIN) tablet Take 1 tablet by mouth daily.         Current Facility-Administered Medications on File Prior to Visit  Medication Dose Route Frequency Provider Last Rate Last Dose  . 0.9 %  sodium chloride infusion  500 mL Intravenous Continuous Hilarie Fredrickson, MD         Review of Systems Review of Systems  Constitutional: Negative for fever, appetite change, fatigue and unexpected weight change.  Eyes: Negative for pain and visual disturbance.  Respiratory: Negative for cough and shortness of breath.   Cardiovascular: Negative for cp or palpitations    Gastrointestinal: Negative for constipation/ abd pain / blood in stool or dark stool   Genitourinary: Negative for urgency and frequency.  Skin: Negative for pallor or rash   Neurological: Negative for weakness, light-headedness, numbness and headaches.  Hematological: Negative for adenopathy. Does not bruise/bleed easily.  Psychiatric/Behavioral: Negative for  dysphoric mood. The patient is not nervous/anxious.         Objective:   Physical Exam  Constitutional: He appears well-developed and well-nourished. No distress.  HENT:  Head: Normocephalic and atraumatic.  Mouth/Throat: Oropharynx is clear and moist. No oropharyngeal exudate.  Eyes: Conjunctivae and EOM are normal. Pupils are equal, round, and reactive to light. No scleral icterus.  Neck: Normal range of motion. Neck supple. No JVD present. No thyromegaly present.  Cardiovascular: Normal rate, regular rhythm, normal heart sounds and intact distal pulses.  Exam reveals no gallop.   Pulmonary/Chest: Effort normal and breath sounds normal. No respiratory distress. He has no wheezes.  Abdominal: Soft. Bowel sounds are normal. He exhibits no distension and no mass. There is no tenderness. There is no rebound and no guarding.  Musculoskeletal: He exhibits no edema and no tenderness.  Lymphadenopathy:    He has no cervical adenopathy.  Neurological: He is alert. He has normal reflexes. No cranial nerve deficit. He exhibits normal muscle tone. Coordination normal.  Skin: Skin is warm and dry. No rash noted. No erythema. No pallor.       No jaundice Brisk capillary refil time   Psychiatric: He has a normal mood and affect.          Assessment & Plan:

## 2011-08-29 ENCOUNTER — Other Ambulatory Visit (INDEPENDENT_AMBULATORY_CARE_PROVIDER_SITE_OTHER): Payer: PRIVATE HEALTH INSURANCE

## 2011-08-29 DIAGNOSIS — D45 Polycythemia vera: Secondary | ICD-10-CM

## 2011-08-29 DIAGNOSIS — D751 Secondary polycythemia: Secondary | ICD-10-CM

## 2011-08-29 DIAGNOSIS — R197 Diarrhea, unspecified: Secondary | ICD-10-CM | POA: Insufficient documentation

## 2011-08-29 NOTE — Telephone Encounter (Signed)
Thanks - I am ordering the tests now - so schedule any time./ non fasting  Then have him f/u with me next week please

## 2011-08-29 NOTE — Telephone Encounter (Signed)
Patient advised as instructed via telephone, he is on his way to our office to have labs drawn and to pick up ifob.  Patient stated that he will be out of town next week but will schedule an appt with Dr. Milinda Antis for the following week.

## 2011-08-29 NOTE — Telephone Encounter (Signed)
Spoke with patient and advised as instructed, he stated that he feels ok.  He didn't eat anything outside the braty diet on yesterday and had one episode of vomiting and diarrhea.  The only thing he has had today is water.  He wants to come back to for stool kit and repeat cbc.

## 2011-08-29 NOTE — Telephone Encounter (Signed)
Left message on cell phone voicemail for patient to return call. 

## 2011-08-30 ENCOUNTER — Telehealth: Payer: Self-pay | Admitting: *Deleted

## 2011-08-30 ENCOUNTER — Telehealth: Payer: Self-pay | Admitting: Family Medicine

## 2011-08-30 DIAGNOSIS — D696 Thrombocytopenia, unspecified: Secondary | ICD-10-CM

## 2011-08-30 DIAGNOSIS — E669 Obesity, unspecified: Secondary | ICD-10-CM

## 2011-08-30 DIAGNOSIS — D751 Secondary polycythemia: Secondary | ICD-10-CM

## 2011-08-30 LAB — CBC WITH DIFFERENTIAL/PLATELET
Basophils Relative: 0.2 % (ref 0.0–3.0)
Eosinophils Relative: 5.6 % — ABNORMAL HIGH (ref 0.0–5.0)
HCT: 53.9 % — ABNORMAL HIGH (ref 39.0–52.0)
Hemoglobin: 18.1 g/dL (ref 13.0–17.0)
Lymphs Abs: 1.9 10*3/uL (ref 0.7–4.0)
MCV: 87.8 fl (ref 78.0–100.0)
Monocytes Absolute: 1 10*3/uL (ref 0.1–1.0)
Monocytes Relative: 11.2 % (ref 3.0–12.0)
Neutro Abs: 5.4 10*3/uL (ref 1.4–7.7)
WBC: 8.8 10*3/uL (ref 4.5–10.5)

## 2011-08-30 NOTE — Telephone Encounter (Signed)
I am aware- comments are linked to the lab,thanks

## 2011-08-30 NOTE — Telephone Encounter (Signed)
I will go ahead and get ref done to heme

## 2011-08-30 NOTE — Telephone Encounter (Signed)
Message copied by Judy Pimple on Thu Aug 30, 2011  3:28 PM ------      Message from: Gilmer Mor      Created: Thu Aug 30, 2011  3:15 PM       Patient advised as instructed via telephone, he stated that he feels fine no vomiting or diarrhea.  He was able to eat some chicken soup last night with no problems.  He is trying to drink more water.  He agrees to see a Hematologist but he has a lot going on between now and the first week in June.  He will call back tomorrow and let us know what days he can go for an appt.

## 2011-08-30 NOTE — Telephone Encounter (Signed)
Critical Lab Hgb 18.1 (Per Dr Alphonsus Sias, this result is ok to wait for Dr Milinda Antis since she is aware of previous abn results 2 days ago)

## 2011-08-31 ENCOUNTER — Telehealth: Payer: Self-pay | Admitting: Radiology

## 2011-08-31 ENCOUNTER — Other Ambulatory Visit: Payer: Self-pay | Admitting: Family Medicine

## 2011-08-31 DIAGNOSIS — R197 Diarrhea, unspecified: Secondary | ICD-10-CM

## 2011-08-31 DIAGNOSIS — K529 Noninfective gastroenteritis and colitis, unspecified: Secondary | ICD-10-CM

## 2011-08-31 NOTE — Telephone Encounter (Signed)
Stool samples brought to the lab, released open orders

## 2011-09-01 LAB — FECAL LACTOFERRIN, QUANT: Lactoferrin: POSITIVE

## 2011-09-03 ENCOUNTER — Telehealth: Payer: Self-pay | Admitting: *Deleted

## 2011-09-03 LAB — GIARDIA ANTIGEN: Giardia Screen (EIA): NEGATIVE

## 2011-09-03 NOTE — Telephone Encounter (Signed)
PATIENT CONFIRMED OVER THE PHONE THE NEW  DATE AND TIME ON 09-20-2011 STARTING AT 10:00AM WITH Gerilyn Pilgrim

## 2011-09-07 ENCOUNTER — Telehealth: Payer: Self-pay | Admitting: *Deleted

## 2011-09-07 NOTE — Telephone Encounter (Signed)
Patient called to let Dr. Milinda Antis know that he is still vomiting.  He was getting sick on the phone and had to put the phone down.  He is also having diarrhea again as well.  This all started again on yesterday.  He stated that he started burping again last night and started feeling bloated and then the n/v/d came on again.  He stated that he hasn't really eaten anything expect a few crackers and drank one bottle of water.  Has no complaints of fever, chills, shortness of breath or difficulty breathing.  Please advise.  Uses [facility].

## 2011-09-07 NOTE — Telephone Encounter (Signed)
Patient advised as instructed via telephone, appt scheduled for 10:00 am tomorrow with Dr. Milinda Antis.

## 2011-09-07 NOTE — Telephone Encounter (Signed)
I'm sorry to hear he is sick again - please schedule him for Saturday clinic (I will be there tomorrow)  If worse tonight however- if s/s dehydration or intractable vomiting- go to ER (also if fever or abd pain or blood in stool)

## 2011-09-08 ENCOUNTER — Ambulatory Visit (INDEPENDENT_AMBULATORY_CARE_PROVIDER_SITE_OTHER): Payer: PRIVATE HEALTH INSURANCE | Admitting: Family Medicine

## 2011-09-08 ENCOUNTER — Encounter: Payer: Self-pay | Admitting: Family Medicine

## 2011-09-08 VITALS — BP 130/90 | HR 80 | Temp 97.3°F | Wt 212.0 lb

## 2011-09-08 DIAGNOSIS — K2970 Gastritis, unspecified, without bleeding: Secondary | ICD-10-CM

## 2011-09-08 DIAGNOSIS — R197 Diarrhea, unspecified: Secondary | ICD-10-CM

## 2011-09-08 NOTE — Progress Notes (Signed)
Subjective:    Patient ID: Erik [name], male    DOB: 08-13-1955, 56 y.o.   MRN: [id number]  HPI Here for f/u of GI symptoms  After last visit - ate a bland diet and 1-2 d later back to normal   This past Thursday- had a normal day , had lunch and then started feeling very bloated and burps that smell bad  Then got back to work -- bloating was back and burping back  Stomach growled and diarrhea started Diarrhea all day yesterday- at work  Finally on Friday he vomited and that gave him some relief - helped a lot  No cramping  No chills , - achey on Friday   Today is feeling tired - but better in general , today the diarrhea quit (is eating very little and went back to the brat diet)   He does admit to holding stool too long at work   [employer] Tuesday Stopped goodys   Patient Active Problem List  Diagnoses  . HYPERLIPIDEMIA, MILD  . THROMBOCYTOPENIA  . ELEVATED BLOOD PRESSURE WITHOUT DIAGNOSIS OF HYPERTENSION  . HEADACHE  . HEMOCCULT POSITIVE STOOL  . Cough  . Dyspnea on exertion  . Obesity  . Gastroenteritis  . Polycythemia  . Diarrhea   Past Medical History  Diagnosis Date  . Chronic headaches   . Allergy     seasonal  . Arthritis   . Cataract     surgery  . GERD (gastroesophageal reflux disease)     with HH  . Labile blood pressure   . Elevated liver enzymes   . Hyperlipidemia   . Eczema    Past Surgical History  Procedure Date  . Cataract extraction w/ intraocular lens  implant, bilateral   . Cholecystectomy 2007  . Upper gastrointestinal endoscopy   . Knee arthroscopy     right  . Back surgery     Ruptured disk with staph infection  . Esophagogastroduodenoscopy 2001    Stricture/GERD and HH  . Abdominal US 2002    Negative  . Cervical disc surgery     with Staph infection   History  Substance Use Topics  . Smoking status: Former Smoker    Quit date: 04/16/1989  . Smokeless tobacco: Never Used  . Alcohol Use: Yes     Rare, almost none.     Family History  Problem Relation Age of Onset  . Heart disease Father     Heart problems, CABG  . Heart disease Maternal Grandfather     MI   No Known Allergies Current Outpatient Prescriptions on File Prior to Visit  Medication Sig Dispense Refill  . lansoprazole (PREVACID) 30 MG capsule take 1 capsule by mouth once daily  30 capsule  9  . Aspirin-Acetaminophen-Caffeine (GOODY HEADACHE PO) Take 1 packet by mouth as needed. headache       . B Complex Vitamins (VITAMIN B COMPLEX) CAPS Take 1 capsule by mouth daily.        . Calcium-Magnesium-Vitamin D (CITRACAL CALCIUM+D) 600-40-500 MG-MG-UNIT TB24 Take 1 tablet by mouth daily.        . cyclobenzaprine (FLEXERIL) 10 MG tablet Take 10 mg by mouth 2 (two) times daily as needed.        Marland Kitchen EPINEPHrine (EPI-PEN) 0.3 mg/0.3 mL DEVI Inject 0.3 mLs (0.3 mg total) into the muscle once.  1 Device  3  . ibuprofen (ADVIL,MOTRIN) 200 MG tablet Take 200 mg by mouth every 6 (six) hours as needed.        Marland Kitchen  Multiple Vitamin (MULTIVITAMIN) tablet Take 1 tablet by mouth daily.         Current Facility-Administered Medications on File Prior to Visit  Medication Dose Route Frequency Provider Last Rate Last Dose  . 0.9 %  sodium chloride infusion  500 mL Intravenous Continuous Hilarie Fredrickson, MD         Review of Systems Review of Systems  Constitutional: Negative for fever, appetite change, fatigue and unexpected weight change. (is still hungry) Eyes: Negative for pain and visual disturbance.  Respiratory: Negative for cough and shortness of breath.   Cardiovascular: Negative for cp or palpitations    Gastrointestinal: Negative for abdominal pain or blood in stool, pos for bloating (vomiting and diarrhea are resolved now) Genitourinary: Negative for urgency and frequency.  Skin: Negative for pallor or rash   Neurological: Negative for weakness, light-headedness, numbness and headaches.  Hematological: Negative for adenopathy. Does not bruise/bleed  easily.  Psychiatric/Behavioral: Negative for dysphoric mood. The patient is not nervous/anxious.          Objective:   Physical Exam  Constitutional: He appears well-developed and well-nourished. No distress.  HENT:  Head: Normocephalic and atraumatic.  Mouth/Throat: Oropharynx is clear and moist.  Eyes: Conjunctivae and EOM are normal. Pupils are equal, round, and reactive to light. No scleral icterus.  Neck: Normal range of motion. Neck supple. No JVD present. No thyromegaly present.  Cardiovascular: Normal rate, regular rhythm and normal heart sounds.   Pulmonary/Chest: Effort normal and breath sounds normal. No respiratory distress. He has no wheezes.  Abdominal: Soft. Bowel sounds are normal. He exhibits no distension and no mass. There is no tenderness. There is no rebound and no guarding.  Musculoskeletal: He exhibits no edema.  Lymphadenopathy:    He has no cervical adenopathy.  Neurological: He is alert. He has normal reflexes.  Skin: Skin is warm and dry. No rash noted. No erythema. No pallor.       Brisk capillary refil  Psychiatric: He has a normal mood and affect.          Assessment & Plan:

## 2011-09-08 NOTE — Patient Instructions (Signed)
Take no NSAIDs of any kind - aspririn / ibuprofen/ aleve Tylenol only for pain  Drink water to avoid dehydration Parke Simmers and BRAT diet for next week (not fatty or spicy) No caffeine   (just drink water0 Continue prevacid  If worse / fever or other symptoms --call  Update on Tuesday and based on symptoms will make a plan

## 2011-09-08 NOTE — Assessment & Plan Note (Signed)
Pt felt better until eating BBQ, [place] food and taking aleve Is improved today nl exam  Disc bland/ BRAT diet and water only for beverage Continue prevacid No nsaids Update tues- consid GI ref

## 2011-09-08 NOTE — Assessment & Plan Note (Signed)
Recurrent - now resolved again  (assoc with feeling of bloating/ gastritis and vomiting) Rev stool tests and pos lactoferritin but neg cx and other tests  Re assuring exam  Disc bland / BRAT diet - no nsaids - update tues  Will likely ref to GI  Had colonosc recent nl - reassuring

## 2011-09-11 ENCOUNTER — Telehealth: Payer: Self-pay | Admitting: Oncology

## 2011-09-11 ENCOUNTER — Ambulatory Visit: Payer: PRIVATE HEALTH INSURANCE | Admitting: Family Medicine

## 2011-09-11 NOTE — Telephone Encounter (Signed)
Dx-Polycythemia

## 2011-09-13 ENCOUNTER — Encounter: Payer: Self-pay | Admitting: Oncology

## 2011-09-20 ENCOUNTER — Ambulatory Visit: Payer: PRIVATE HEALTH INSURANCE

## 2011-09-20 ENCOUNTER — Ambulatory Visit (HOSPITAL_BASED_OUTPATIENT_CLINIC_OR_DEPARTMENT_OTHER): Payer: PRIVATE HEALTH INSURANCE | Admitting: Oncology

## 2011-09-20 ENCOUNTER — Other Ambulatory Visit (HOSPITAL_BASED_OUTPATIENT_CLINIC_OR_DEPARTMENT_OTHER): Payer: PRIVATE HEALTH INSURANCE | Admitting: Lab

## 2011-09-20 ENCOUNTER — Encounter: Payer: Self-pay | Admitting: Oncology

## 2011-09-20 ENCOUNTER — Telehealth: Payer: Self-pay | Admitting: Oncology

## 2011-09-20 VITALS — BP 146/91 | HR 66 | Temp 97.0°F | Ht 71.0 in | Wt 210.5 lb

## 2011-09-20 DIAGNOSIS — R143 Flatulence: Secondary | ICD-10-CM

## 2011-09-20 DIAGNOSIS — D751 Secondary polycythemia: Secondary | ICD-10-CM

## 2011-09-20 DIAGNOSIS — D45 Polycythemia vera: Secondary | ICD-10-CM

## 2011-09-20 DIAGNOSIS — R141 Gas pain: Secondary | ICD-10-CM

## 2011-09-20 DIAGNOSIS — K219 Gastro-esophageal reflux disease without esophagitis: Secondary | ICD-10-CM

## 2011-09-20 DIAGNOSIS — D696 Thrombocytopenia, unspecified: Secondary | ICD-10-CM

## 2011-09-20 HISTORY — DX: Thrombocytopenia, unspecified: D69.6

## 2011-09-20 LAB — CBC WITH DIFFERENTIAL/PLATELET
Basophils Absolute: 0.1 10*3/uL (ref 0.0–0.1)
Eosinophils Absolute: 0.6 10*3/uL — ABNORMAL HIGH (ref 0.0–0.5)
HCT: 47.6 % (ref 38.4–49.9)
LYMPH%: 24.5 % (ref 14.0–49.0)
MCV: 84 fL (ref 79.3–98.0)
MONO#: 0.6 10*3/uL (ref 0.1–0.9)
MONO%: 10.6 % (ref 0.0–14.0)
NEUT#: 3 10*3/uL (ref 1.5–6.5)
NEUT%: 53.9 % (ref 39.0–75.0)
Platelets: 137 10*3/uL — ABNORMAL LOW (ref 140–400)
RBC: 5.67 10*6/uL (ref 4.20–5.82)
WBC: 5.7 10*3/uL (ref 4.0–10.3)

## 2011-09-20 LAB — MORPHOLOGY: PLT EST: ADEQUATE

## 2011-09-20 LAB — CHCC SMEAR

## 2011-09-20 NOTE — Progress Notes (Signed)
[facility]  [phone] [phone]     INITIAL HEMATOLOGY CONSULTATION    Referral MD:  Dr. Idamae Schuller A. [name], M.D.  Reason for Referral: polycythemia.     HPI:  Mr. Erik [name] [name] is a 56 year-old man with no significant pas medical history.  He was noted to have persistent polycythemia within the past year.  Thus, he was kindly referred to the [facility] for evaluation.  The oldest CBC available on EPIC dated back to 05/03/2006 where WBC 5.2; Hgb 16.2; plt 206.  The most recent CBC on 08/29/2011 showed WBC 8.8, hgb 18.1; Plt 148.    Mr. Erik [name] [name] presented to the Cancer Center for the first time today by himself.  He reported feeling well except for some GI complaint.  For the past few weeks, he has developed frequent belching of foul-smelling food odor.  He also has intermittent without abdominal pain.  He denied nausea/vomiting, hematochezia, melena, anorexia, weight loss.  With respect to his presenting complaint of polycythemia, he denied skin rash, pruritus, chest pain, SOB, leg swelling.   Patient denies fatigue, headache, visual changes, confusion, drenching night sweats, palpable lymph node swelling, mucositis, odynophagia, dysphagia, nausea vomiting, jaundice, chest pain, palpitation, shortness of breath, dyspnea on exertion, productive cough, gum bleeding, epistaxis, hematemesis, hemoptysis, abdominal pain, abdominal swelling, early satiety, melena, hematochezia, hematuria, skin rash, spontaneous bleeding, joint swelling, joint pain, heat or cold intolerance, bowel bladder incontinence, back pain, focal motor weakness, paresthesia, depression, suicidal or homocidal ideation, feeling hopelessness.   Past Medical History  Diagnosis Date  . Chronic headaches   . Allergy     seasonal  . Arthritis   . Cataract     surgery  . GERD (gastroesophageal reflux disease)     with HH  . Labile blood pressure   . Elevated liver enzymes   .  Hyperlipidemia   . Eczema   . Polycythemia   . Thrombocytopenia 09/20/2011  :    Past Surgical History  Procedure Date  . Cataract extraction w/ intraocular lens  implant, bilateral   . Cholecystectomy 2007  . Upper gastrointestinal endoscopy   . Knee arthroscopy     right  . Back surgery     Ruptured disk with staph infection  . Esophagogastroduodenoscopy 2001    Stricture/GERD and HH  . Abdominal US 2002    Negative  . Cervical disc surgery     with Staph infection  :   CURRENT MEDS: Current Outpatient Prescriptions  Medication Sig Dispense Refill  . EPINEPHrine (EPI-PEN) 0.3 mg/0.3 mL DEVI Inject 0.3 mLs (0.3 mg total) into the muscle once.  1 Device  3  . lansoprazole (PREVACID) 30 MG capsule take 1 capsule by mouth once daily  30 capsule  9   Current Facility-Administered Medications  Medication Dose Route Frequency Provider Last Rate Last Dose  . DISCONTD: 0.9 %  sodium chloride infusion  500 mL Intravenous Continuous Hilarie Fredrickson, MD          Allergies  Allergen Reactions  . Shellfish Allergy Anaphylaxis  :  Family History  Problem Relation Age of Onset  . Heart disease Father     Heart problems, CABG  . Heart failure Father   . Heart disease Maternal Grandfather     MI  . Cancer Mother     breast CA  :  History   Social History  . Marital Status: Married    Spouse Name: N/A  Number of Children: 1  . Years of Education: N/A   Occupational History  .      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    Social History Main Topics  . Smoking status: Former Smoker    Quit date: 04/16/1989  . Smokeless tobacco: Never Used  . Alcohol Use: Yes     Rare, almost none.  . Drug Use: No  . Sexually Active: Yes   Other Topics Concern  . Not on file   Social History Narrative   Some exercise.  Hobby:  Hand-making bullets.  :  REVIEW OF SYSTEM:  The rest of the 14-point review of sytem was negative.   Exam: ECOG  0  General:  well-nourished man in no acute distress.  Eyes:  no scleral icterus.  ENT:  There were no oropharyngeal lesions.  Neck was without thyromegaly.  Lymphatics:  Negative cervical, supraclavicular or axillary adenopathy.  Respiratory: lungs were clear bilaterally without wheezing or crackles.  Cardiovascular:  Regular rate and rhythm, S1/S2, without murmur, rub or gallop.  There was no pedal edema.  GI:  abdomen was soft, flat, nontender, nondistended, without organomegaly.  Muscoloskeletal:  no spinal tenderness of palpation of vertebral spine.  Skin exam was without echymosis, petichae.  Neuro exam was nonfocal.  Patient was able to get on and off exam table without assistance.  Gait was normal.  Patient was alerted and oriented.  Attention was good.   Language was appropriate.  Mood was normal without depression.  Speech was not pressured.  Thought content was not tangential.    LABS:  Lab Results  Component Value Date   WBC 5.7 09/20/2011   HGB 16.7 09/20/2011   HCT 47.6 09/20/2011   PLT 137* 09/20/2011   GLUCOSE 140* 08/28/2011   CHOL 138 06/05/2010   TRIG 152.0* 06/05/2010   HDL 31.00* 06/05/2010   LDLCALC 77 06/05/2010   ALT 29 08/28/2011   AST 30 08/28/2011   NA 140 08/28/2011   K 4.8 08/28/2011   CL 103 08/28/2011   CREATININE 1.2 08/28/2011   BUN 26* 08/28/2011   CO2 29 08/28/2011   PSA 1.36 11/28/2009   HGBA1C 5.8 06/05/2010    Blood smear review:   I personally reviewed the patient's peripheral blood smear today.  There was isocytosis.  There was no peripheral blast.  There was no schistocytosis, spherocytosis, target cell, rouleaux formation, tear drop cell.  There was occasional giant platelets but no platelet clumps.      ASSESSMENT AND PLAN:   1.  GERD:  He is on PPI per PCP.  2.  Frequent foul-smelling eructation:  I advised him to discuss with Dr. Lowell [name] if he has persistent or worsening symptoms.  Differential: esophageal diverticulum?  3.  Polycythemia: - Most likely  secondary to exposure to exhaust fume at work causing relative hypoxia.  I cannot rule out polycythemia vera.  Clinically, there is no focal symptom to raise the possibility of occult malignancy to cause secondary polycythemia.  He is up to date with his age-appropriate cancer screening. - Work up:  I sent for JAK-2 mutation which is sensitive for polycythemia vera.   - Treatment:  Awaiting JAK-2 mutation result.  If positive, he has PV.  The treatment would be ASA 81 mg PO daily to decrease the risk of thrombosis.  He never had thrombosis and does not warrant Hydrea before age 2.  On the other hand, if JAK-2 mutation is negative, most  likely he does not have PV, but he has secondary polycythemia instead.  This does not increase his risk of thrombosis.    4.  Thrombocytopenia: - Differential diagnosis:  Due to PPI vs giant platelet syndrome.   - His plt is still >100K.  I discussed with patient that with plt >30K, he is not at higher risk of spontaneous hemorrhage.  No indication for work up at this time.  We will follow his CBC and consider further work up in the future if Plt <100K.  5. Follow up:  Recheck CBC at the Cancer Center in about 4 and 8 months.  Return visit in about 1 year.     Thank you for this referral.   The length of time of the face-to-face encounter was 45 minutes. More than 50% of time was spent counseling and coordination of care.

## 2011-09-20 NOTE — Progress Notes (Signed)
Patient came in this morning as a new patient he has one insurance [employer].I did explain to him our co-pay assistance program we offer here for medication and chemo drugs,but he said he think he is oh kay with his insurance he also said they pay pretty good.

## 2011-09-20 NOTE — Telephone Encounter (Signed)
Gave pt appt calendar for 4 mos and 8 mos lab and see ML in one year 2014

## 2011-09-26 ENCOUNTER — Telehealth: Payer: Self-pay | Admitting: Oncology

## 2011-09-26 NOTE — Telephone Encounter (Signed)
I called and left a message in patients voice mail today. His JAK testing was negative. Most likely he does not have polycythemia vera. His polycythemia is secondary to chronic exposure to exhaust fume.   I advised him to limit exposed to exhaust fume as much as possible. I advised him to keep appointments cancer Center into 4 and 8 months for CBC check. In the future if he has worsening polycythemia despite limit his exposure to exhaust fume, I may consider further evaluation.

## 2011-10-31 ENCOUNTER — Ambulatory Visit: Payer: PRIVATE HEALTH INSURANCE | Admitting: Family Medicine

## 2011-11-07 ENCOUNTER — Ambulatory Visit (INDEPENDENT_AMBULATORY_CARE_PROVIDER_SITE_OTHER): Payer: PRIVATE HEALTH INSURANCE | Admitting: Family Medicine

## 2011-11-07 ENCOUNTER — Encounter: Payer: Self-pay | Admitting: Family Medicine

## 2011-11-07 VITALS — BP 116/64 | HR 68 | Temp 97.6°F | Ht 73.0 in | Wt 206.8 lb

## 2011-11-07 DIAGNOSIS — D751 Secondary polycythemia: Secondary | ICD-10-CM

## 2011-11-07 DIAGNOSIS — K2990 Gastroduodenitis, unspecified, without bleeding: Secondary | ICD-10-CM

## 2011-11-07 DIAGNOSIS — D45 Polycythemia vera: Secondary | ICD-10-CM

## 2011-11-07 DIAGNOSIS — K2970 Gastritis, unspecified, without bleeding: Secondary | ICD-10-CM

## 2011-11-07 NOTE — Assessment & Plan Note (Signed)
Much improved with prevacid/ diet change  He has significant symptoms if he misses a day so will continue the prevacid  Will continue working on lifestyle F/u 6 mo annual exam with labs

## 2011-11-07 NOTE — Patient Instructions (Addendum)
Keep up the good work with better diet - I think this is helping Continue prevacid  Keep watching out for exhaust fumes the best you can  Follow up for annual exam in about 6 months with labs prior

## 2011-11-07 NOTE — Progress Notes (Signed)
Subjective:    Patient ID: Erik [name], male    DOB: Jun 07, 1955, 56 y.o.   MRN: [id number]  HPI Here for f/u of gastritis/ gerd and other chronic problems  On prevacid No more burping and bloating episodes No more diarrhea  Cut down portions and got rid of caffeine and also watching the foods he eats  Wt down 4 lb-- is proud of that  No nsaids at all     Saw hematology for polycythemia Was thought to be due to chronic exp to exhaust fumes Lab Results  Component Value Date   WBC 5.7 09/20/2011   HGB 16.7 09/20/2011   HCT 47.6 09/20/2011   MCV 84.0 09/20/2011   PLT 137* 09/20/2011   in general this has improved -- is trying to avoid the exposure With his work -- he still gets exp to black soot and exhaust  Feeling ok  Has occ headache  Uses tylenol- does not work well   Patient Active Problem List  Diagnosis  . HYPERLIPIDEMIA, MILD  . THROMBOCYTOPENIA  . ELEVATED BLOOD PRESSURE WITHOUT DIAGNOSIS OF HYPERTENSION  . HEADACHE  . HEMOCCULT POSITIVE STOOL  . Cough  . Dyspnea on exertion  . Obesity  . Gastroenteritis  . Polycythemia  . Diarrhea  . Gastritis  . Thrombocytopenia   Past Medical History  Diagnosis Date  . Chronic headaches   . Allergy     seasonal  . Arthritis   . Cataract     surgery  . GERD (gastroesophageal reflux disease)     with HH  . Labile blood pressure   . Elevated liver enzymes   . Hyperlipidemia   . Eczema   . Polycythemia   . Thrombocytopenia 09/20/2011   Past Surgical History  Procedure Date  . Cataract extraction w/ intraocular lens  implant, bilateral   . Cholecystectomy 2007  . Upper gastrointestinal endoscopy   . Knee arthroscopy     right  . Back surgery     Ruptured disk with staph infection  . Esophagogastroduodenoscopy 2001    Stricture/GERD and HH  . Abdominal US 2002    Negative  . Cervical disc surgery     with Staph infection   History  Substance Use Topics  . Smoking status: Former Smoker    Quit date:  04/16/1989  . Smokeless tobacco: Never Used  . Alcohol Use: Yes     Rare, almost none.   Family History  Problem Relation Age of Onset  . Heart disease Father     Heart problems, CABG  . Heart failure Father   . Heart disease Maternal Grandfather     MI  . Cancer Mother     breast CA   Allergies  Allergen Reactions  . Shellfish Allergy Anaphylaxis   Current Outpatient Prescriptions on File Prior to Visit  Medication Sig Dispense Refill  . EPINEPHrine (EPI-PEN) 0.3 mg/0.3 mL DEVI Inject 0.3 mLs (0.3 mg total) into the muscle once.  1 Device  3  . lansoprazole (PREVACID) 30 MG capsule take 1 capsule by mouth once daily  30 capsule  9       Review of Systems Review of Systems  Constitutional: Negative for fever, appetite change, fatigue and unexpected weight change.  Eyes: Negative for pain and visual disturbance.  Respiratory: Negative for cough and shortness of breath.   Cardiovascular: Negative for cp or palpitations    Gastrointestinal: Negative for nausea, diarrhea and constipation. neg for heartburn or abd pain or  bloating Genitourinary: Negative for urgency and frequency.  Skin: Negative for pallor or rash   Neurological: Negative for weakness, light-headedness, numbness and headaches.  Hematological: Negative for adenopathy. Does not bruise/bleed easily.  Psychiatric/Behavioral: Negative for dysphoric mood. The patient is not nervous/anxious.         Objective:   Physical Exam  Constitutional: He appears well-developed and well-nourished. No distress.  HENT:  Head: Normocephalic and atraumatic.  Mouth/Throat: Oropharynx is clear and moist.  Eyes: Conjunctivae and EOM are normal. Pupils are equal, round, and reactive to light. No scleral icterus.  Neck: Normal range of motion. Neck supple.  Cardiovascular: Normal rate, regular rhythm and normal heart sounds.   No murmur heard. Pulmonary/Chest: Effort normal and breath sounds normal. No respiratory distress. He  has no wheezes.  Abdominal: Soft. Bowel sounds are normal. He exhibits no distension and no mass. There is no tenderness. There is no rebound and no guarding.  Lymphadenopathy:    He has no cervical adenopathy.  Neurological: He is alert.  Skin: Skin is warm and dry. No erythema. No pallor.  Psychiatric: He has a normal mood and affect.          Assessment & Plan:

## 2011-11-07 NOTE — Assessment & Plan Note (Signed)
Seeing hematology- and thought to be 2ndary to exhaust fume exp  Disc use of respirator and other means to protect himself in his work Overall doing will and will continue to follow F/u 83mo

## 2011-11-27 ENCOUNTER — Encounter: Payer: Self-pay | Admitting: Oncology

## 2011-11-27 NOTE — Progress Notes (Signed)
I did call the patient insurance company this morning which is [employer] ,to find out why they denied or refuse to pay for his blood test.I did speak with Telford Nab in the lab and she said that they did use the right diagnosis code,I also spoke with Herbert Seta at [employer] and she said that she would send it over to review.I did contact Mr.Hamblin to let him know what was going on and he said he really appreciate it.

## 2012-01-21 ENCOUNTER — Telehealth: Payer: Self-pay | Admitting: *Deleted

## 2012-01-21 ENCOUNTER — Other Ambulatory Visit (HOSPITAL_BASED_OUTPATIENT_CLINIC_OR_DEPARTMENT_OTHER): Payer: PRIVATE HEALTH INSURANCE

## 2012-01-21 DIAGNOSIS — D751 Secondary polycythemia: Secondary | ICD-10-CM

## 2012-01-21 DIAGNOSIS — D696 Thrombocytopenia, unspecified: Secondary | ICD-10-CM

## 2012-01-21 LAB — CBC WITH DIFFERENTIAL/PLATELET
Basophils Absolute: 0 10*3/uL (ref 0.0–0.1)
EOS%: 1.1 % (ref 0.0–7.0)
Eosinophils Absolute: 0.1 10*3/uL (ref 0.0–0.5)
HCT: 48.3 % (ref 38.4–49.9)
HGB: 16.7 g/dL (ref 13.0–17.1)
MCH: 29.1 pg (ref 27.2–33.4)
MCV: 84.2 fL (ref 79.3–98.0)
MONO%: 8.3 % (ref 0.0–14.0)
NEUT%: 82.3 % — ABNORMAL HIGH (ref 39.0–75.0)
Platelets: 124 10*3/uL — ABNORMAL LOW (ref 140–400)

## 2012-01-21 NOTE — Telephone Encounter (Signed)
Called pt w/ lab results, informed Platelet count down slightly,  Continue to observe,  Keep next lab appt in Feb 2014 as scheduled.  Pt verbalized understanding.

## 2012-01-21 NOTE — Telephone Encounter (Signed)
Message copied by Wende Mott on Mon Jan 21, 2012  2:00 PM ------      Message from: Jethro Bolus T      Created: Mon Jan 21, 2012  1:41 PM       Please call pt.  His plt has slightly decreased; not by much.  I again recommended observation.  Thanks.

## 2012-01-29 ENCOUNTER — Ambulatory Visit (INDEPENDENT_AMBULATORY_CARE_PROVIDER_SITE_OTHER): Payer: PRIVATE HEALTH INSURANCE | Admitting: Family Medicine

## 2012-01-29 ENCOUNTER — Encounter: Payer: Self-pay | Admitting: Family Medicine

## 2012-01-29 VITALS — BP 140/88 | HR 76 | Temp 98.2°F | Ht 73.0 in | Wt 210.5 lb

## 2012-01-29 DIAGNOSIS — J069 Acute upper respiratory infection, unspecified: Secondary | ICD-10-CM

## 2012-01-29 NOTE — Assessment & Plan Note (Signed)
With reassuring exam-disc poss of viral bronchitis (no reactive airways) Since mucinex DM is helping and he is generally improving - adv to continue that Disc symptomatic care - see instructions on AVS  Update if not starting to improve in a week or if worsening  - esp if fever or wheezing

## 2012-01-29 NOTE — Progress Notes (Signed)
Subjective:    Patient ID: Erik [name], male    DOB: Aug 25, 1955, 56 y.o.   MRN: [id number]  HPI Started getting sick one week ago  Fairly bad ST - now is better than it was  Glenford Peers symptoms -- nasal symptoms are calmed down- was congested- getting better now  Cough and a lot of rumbling in his chest  Some phlegm -- most of the time is clear with a little yellow   No fever  Taking day quil over the counter or mucinex (not at the same time)  Now mucinex DM  Patient Active Problem List  Diagnosis  . HYPERLIPIDEMIA, MILD  . ELEVATED BLOOD PRESSURE WITHOUT DIAGNOSIS OF HYPERTENSION  . HEADACHE  . HEMOCCULT POSITIVE STOOL  . Cough  . Dyspnea on exertion  . Obesity  . Polycythemia  . Diarrhea  . Gastritis  . Thrombocytopenia   Past Medical History  Diagnosis Date  . Chronic headaches   . Allergy     seasonal  . Arthritis   . Cataract     surgery  . GERD (gastroesophageal reflux disease)     with HH  . Labile blood pressure   . Elevated liver enzymes   . Hyperlipidemia   . Eczema   . Polycythemia   . Thrombocytopenia 09/20/2011   Past Surgical History  Procedure Date  . Cataract extraction w/ intraocular lens  implant, bilateral   . Cholecystectomy 2007  . Upper gastrointestinal endoscopy   . Knee arthroscopy     right  . Back surgery     Ruptured disk with staph infection  . Esophagogastroduodenoscopy 2001    Stricture/GERD and HH  . Abdominal US 2002    Negative  . Cervical disc surgery     with Staph infection   History  Substance Use Topics  . Smoking status: Former Smoker    Quit date: 04/16/1989  . Smokeless tobacco: Never Used  . Alcohol Use: Yes     Rare, almost none.   Family History  Problem Relation Age of Onset  . Heart disease Father     Heart problems, CABG  . Heart failure Father   . Heart disease Maternal Grandfather     MI  . Cancer Mother     breast CA   Allergies  Allergen Reactions  . Shellfish Allergy Anaphylaxis    Current Outpatient Prescriptions on File Prior to Visit  Medication Sig Dispense Refill  . EPINEPHrine (EPI-PEN) 0.3 mg/0.3 mL DEVI Inject 0.3 mLs (0.3 mg total) into the muscle once.  1 Device  3  . fluocinonide cream (LIDEX) 0.05 % Apply 1 application topically as needed.      . lansoprazole (PREVACID) 30 MG capsule take 1 capsule by mouth once daily  30 capsule  9      Review of Systems    Review of Systems  Constitutional: Negative for fever, appetite change, fatigue and unexpected weight change.  Eyes: Negative for pain and visual disturbance ENt pos for mild cong/ post nasal drip/ neg for sinus pain.  Respiratory: Negative for wheeze and shortness of breath.   Cardiovascular: Negative for cp or palpitations    Gastrointestinal: Negative for nausea, diarrhea and constipation.  Genitourinary: Negative for urgency and frequency.  Skin: Negative for pallor or rash   Neurological: Negative for weakness, light-headedness, numbness and headaches.  Hematological: Negative for adenopathy. Does not bruise/bleed easily.  Psychiatric/Behavioral: Negative for dysphoric mood. The patient is not nervous/anxious.  Objective:   Physical Exam  Constitutional: He appears well-developed and well-nourished. No distress.  HENT:  Head: Normocephalic and atraumatic.  Mouth/Throat: Oropharynx is clear and moist.  Eyes: Conjunctivae normal and EOM are normal. Pupils are equal, round, and reactive to light. Right eye exhibits no discharge. Left eye exhibits no discharge. No scleral icterus.  Neck: Normal range of motion. Neck supple. No JVD present. Carotid bruit is not present. No thyromegaly present.  Cardiovascular: Normal rate, regular rhythm and normal heart sounds.  Exam reveals no gallop.   Pulmonary/Chest: Effort normal and breath sounds normal. No respiratory distress. He has no wheezes. He has no rales. He exhibits no tenderness.       Harsh bs throughout and dry cough noted No  rhonchi or wheezes   Lymphadenopathy:    He has no cervical adenopathy.  Neurological: He is alert. He has normal reflexes. No cranial nerve deficit.  Skin: Skin is warm and dry. No rash noted. No erythema. No pallor.  Psychiatric: He has a normal mood and affect.          Assessment & Plan:

## 2012-01-29 NOTE — Patient Instructions (Addendum)
Drink lots of fluids- get as much rest as you can  I think the mucinex DM twice daily with lots of water  I think your cough will last another 1-2 weeks but if it worsens - ie: fever or worse cough or wheezing- please call and let me know

## 2012-04-15 ENCOUNTER — Other Ambulatory Visit: Payer: Self-pay | Admitting: Family Medicine

## 2012-05-01 ENCOUNTER — Telehealth: Payer: Self-pay | Admitting: Family Medicine

## 2012-05-01 DIAGNOSIS — E785 Hyperlipidemia, unspecified: Secondary | ICD-10-CM

## 2012-05-01 DIAGNOSIS — Z125 Encounter for screening for malignant neoplasm of prostate: Secondary | ICD-10-CM

## 2012-05-01 DIAGNOSIS — Z0000 Encounter for general adult medical examination without abnormal findings: Secondary | ICD-10-CM

## 2012-05-01 NOTE — Telephone Encounter (Signed)
Message copied by Judy Pimple on Thu May 01, 2012  4:03 PM ------      Message from: Erik [name]      Created: Mon Apr 28, 2012  1:12 PM      Regarding: Cpx labs Fri 1/17       Please order  future cpx labs for pt's upcoming lab appt.      Thanks      Rodney Booze

## 2012-05-02 ENCOUNTER — Other Ambulatory Visit (INDEPENDENT_AMBULATORY_CARE_PROVIDER_SITE_OTHER): Payer: BC Managed Care – PPO

## 2012-05-02 DIAGNOSIS — E785 Hyperlipidemia, unspecified: Secondary | ICD-10-CM

## 2012-05-02 DIAGNOSIS — Z0000 Encounter for general adult medical examination without abnormal findings: Secondary | ICD-10-CM

## 2012-05-02 DIAGNOSIS — Z125 Encounter for screening for malignant neoplasm of prostate: Secondary | ICD-10-CM

## 2012-05-02 LAB — COMPREHENSIVE METABOLIC PANEL
ALT: 34 U/L (ref 0–53)
AST: 31 U/L (ref 0–37)
Alkaline Phosphatase: 134 U/L — ABNORMAL HIGH (ref 39–117)
BUN: 17 mg/dL (ref 6–23)
Calcium: 9.7 mg/dL (ref 8.4–10.5)
Chloride: 101 mEq/L (ref 96–112)
Creatinine, Ser: 1 mg/dL (ref 0.4–1.5)
Potassium: 4.3 mEq/L (ref 3.5–5.1)

## 2012-05-02 LAB — CBC WITH DIFFERENTIAL/PLATELET
Basophils Absolute: 0 10*3/uL (ref 0.0–0.1)
Eosinophils Absolute: 0.6 10*3/uL (ref 0.0–0.7)
Lymphocytes Relative: 18.3 % (ref 12.0–46.0)
MCHC: 34 g/dL (ref 30.0–36.0)
MCV: 84.3 fl (ref 78.0–100.0)
Monocytes Absolute: 0.5 10*3/uL (ref 0.1–1.0)
Neutro Abs: 3.8 10*3/uL (ref 1.4–7.7)
Neutrophils Relative %: 63 % (ref 43.0–77.0)
RDW: 12.7 % (ref 11.5–14.6)

## 2012-05-02 LAB — PSA: PSA: 4.56 ng/mL — ABNORMAL HIGH (ref 0.10–4.00)

## 2012-05-02 LAB — LDL CHOLESTEROL, DIRECT: Direct LDL: 75.6 mg/dL

## 2012-05-02 LAB — LIPID PANEL: Cholesterol: 158 mg/dL (ref 0–200)

## 2012-05-09 ENCOUNTER — Ambulatory Visit (INDEPENDENT_AMBULATORY_CARE_PROVIDER_SITE_OTHER): Payer: BC Managed Care – PPO | Admitting: Family Medicine

## 2012-05-09 ENCOUNTER — Encounter: Payer: Self-pay | Admitting: Family Medicine

## 2012-05-09 VITALS — BP 138/82 | HR 85 | Temp 97.5°F | Ht 71.75 in | Wt 211.8 lb

## 2012-05-09 DIAGNOSIS — R972 Elevated prostate specific antigen [PSA]: Secondary | ICD-10-CM

## 2012-05-09 DIAGNOSIS — E119 Type 2 diabetes mellitus without complications: Secondary | ICD-10-CM | POA: Insufficient documentation

## 2012-05-09 DIAGNOSIS — R7309 Other abnormal glucose: Secondary | ICD-10-CM

## 2012-05-09 DIAGNOSIS — E785 Hyperlipidemia, unspecified: Secondary | ICD-10-CM

## 2012-05-09 DIAGNOSIS — R030 Elevated blood-pressure reading, without diagnosis of hypertension: Secondary | ICD-10-CM

## 2012-05-09 DIAGNOSIS — R739 Hyperglycemia, unspecified: Secondary | ICD-10-CM

## 2012-05-09 DIAGNOSIS — Z23 Encounter for immunization: Secondary | ICD-10-CM

## 2012-05-09 DIAGNOSIS — Z125 Encounter for screening for malignant neoplasm of prostate: Secondary | ICD-10-CM

## 2012-05-09 DIAGNOSIS — Z0000 Encounter for general adult medical examination without abnormal findings: Secondary | ICD-10-CM

## 2012-05-09 NOTE — Assessment & Plan Note (Signed)
Long disc about diet / low glycemic and wt loss a1c and f/u 3 mo

## 2012-05-09 NOTE — Assessment & Plan Note (Signed)
psa jumped significantly Ref to urologist

## 2012-05-09 NOTE — Assessment & Plan Note (Signed)
Ref to urol for change in psa No symptoms

## 2012-05-09 NOTE — Assessment & Plan Note (Signed)
Better on 2nd check  Will continue to follow

## 2012-05-09 NOTE — Progress Notes (Signed)
Subjective:    Patient ID: Erik [name], male    DOB: 06-29-1955, 57 y.o.   MRN: [id number]  HPI Here for health maintenance exam and to review chronic medical problems    Feeling ok overall   Wt is up 1 lb with bmi of 28-- gained and then lost  Changed his diet some - less calories (worse around headaches)  Has stopped using goody powders for ha  No caffeine   Mood - overall good  Flu vaccine - he missed them at work   Glucose 135 Plans to work on wt loss and get rid of sugar  Wife is diabetic - knows a lot about that  He drinks too much milk with strawberry/ chocolate in it   colonosc 4/12  Prostate screen Symptoms -none at all and no nocturia  Lab Results  Component Value Date   PSA 4.56* 05/02/2012   PSA 1.36 11/28/2009   PSA 2.01 02/28/2009   this jumped significantly   Hyperlipidemia Lab Results  Component Value Date   CHOL 158 05/02/2012   CHOL 138 06/05/2010   CHOL 162 11/28/2009   Lab Results  Component Value Date   HDL 30.90* 05/02/2012   HDL 31.00* 06/05/2010   HDL 31.70* 11/28/2009   Lab Results  Component Value Date   LDLCALC 77 06/05/2010   LDLCALC 92 11/28/2009   LDLCALC 87 05/31/2009   Lab Results  Component Value Date   TRIG 204.0* 05/02/2012   TRIG 152.0* 06/05/2010   TRIG 194.0* 11/28/2009   Lab Results  Component Value Date   CHOLHDL 5 05/02/2012   CHOLHDL 4 06/05/2010   CHOLHDL 5 11/28/2009   Lab Results  Component Value Date   LDLDIRECT 75.6 05/02/2012   diet is fair overall  He is taking isochrome supplement - for his triglycerides   Hx of borderline bp BP Readings from Last 3 Encounters:  05/09/12 144/94  01/29/12 140/88  11/07/11 116/64    Glucose 135- elevated   Alk phos- is 134- not unusual for him   Hx of inc hb and dec platelets Seen heme Thought to be due to exhaust exp Lab Results  Component Value Date   WBC 6.1 05/02/2012   HGB 17.6* 05/02/2012   HCT 51.8 05/02/2012   MCV 84.3 05/02/2012   PLT 143.0* 05/02/2012       Patient Active Problem List  Diagnosis  . HYPERLIPIDEMIA, MILD  . ELEVATED BLOOD PRESSURE WITHOUT DIAGNOSIS OF HYPERTENSION  . HEADACHE  . HEMOCCULT POSITIVE STOOL  . Dyspnea on exertion  . Obesity  . Polycythemia  . Gastritis  . Thrombocytopenia  . Routine general medical examination at a health care facility  . Prostate cancer screening   Past Medical History  Diagnosis Date  . Chronic headaches   . Allergy     seasonal  . Arthritis   . Cataract     surgery  . GERD (gastroesophageal reflux disease)     with HH  . Labile blood pressure   . Elevated liver enzymes   . Hyperlipidemia   . Eczema   . Polycythemia   . Thrombocytopenia 09/20/2011   Past Surgical History  Procedure Date  . Cataract extraction w/ intraocular lens  implant, bilateral   . Cholecystectomy 2007  . Upper gastrointestinal endoscopy   . Knee arthroscopy     right  . Back surgery     Ruptured disk with staph infection  . Esophagogastroduodenoscopy 2001    Stricture/GERD and HH  .  Abdominal US 2002    Negative  . Cervical disc surgery     with Staph infection   History  Substance Use Topics  . Smoking status: Former Smoker    Quit date: 04/16/1989  . Smokeless tobacco: Never Used  . Alcohol Use: Yes     Comment: Rare, almost none.   Family History  Problem Relation Age of Onset  . Heart disease Father     Heart problems, CABG  . Heart failure Father   . Heart disease Maternal Grandfather     MI  . Cancer Mother     breast CA   Allergies  Allergen Reactions  . Shellfish Allergy Anaphylaxis   Current Outpatient Prescriptions on File Prior to Visit  Medication Sig Dispense Refill  . EPINEPHrine (EPI-PEN) 0.3 mg/0.3 mL DEVI Inject 0.3 mLs (0.3 mg total) into the muscle once.  1 Device  3  . fluocinonide cream (LIDEX) 0.05 % Apply 1 application topically as needed.      Marland Kitchen ibuprofen (ADVIL) 200 MG tablet Take 200 mg by mouth as needed.      . lansoprazole (PREVACID) 30 MG capsule  take 1 capsule by mouth once daily  30 capsule  0    Review of Systems Review of Systems  Constitutional: Negative for fever, appetite change, fatigue and unexpected weight change.  Eyes: Negative for pain and visual disturbance.  Respiratory: Negative for cough and shortness of breath.   Cardiovascular: Negative for cp or palpitations    Gastrointestinal: Negative for nausea, diarrhea and constipation.  Genitourinary: Negative for urgency and frequency.  Skin: Negative for pallor or rash   Neurological: Negative for weakness, light-headedness, numbness and headaches.  Hematological: Negative for adenopathy. Does not bruise/bleed easily.  Psychiatric/Behavioral: Negative for dysphoric mood. The patient is not nervous/anxious.         Objective:   Physical Exam  Constitutional: He appears well-developed and well-nourished. No distress.       overwt and well appearing   HENT:  Head: Normocephalic and atraumatic.  Right Ear: External ear normal.  Left Ear: External ear normal.  Nose: Nose normal.  Mouth/Throat: Oropharynx is clear and moist. No oropharyngeal exudate.  Eyes: Conjunctivae normal and EOM are normal. Pupils are equal, round, and reactive to light. Right eye exhibits no discharge. Left eye exhibits no discharge. No scleral icterus.  Neck: Normal range of motion. Neck supple. No JVD present. No thyromegaly present.  Cardiovascular: Normal rate, regular rhythm, normal heart sounds and intact distal pulses.  Exam reveals no gallop.   Pulmonary/Chest: Effort normal and breath sounds normal. No respiratory distress. He has no wheezes.  Abdominal: Soft. Bowel sounds are normal. He exhibits no distension and no mass. There is no tenderness.  Genitourinary:       DRE def in light of ref to urology  Musculoskeletal: He exhibits no edema and no tenderness.  Lymphadenopathy:    He has no cervical adenopathy.  Neurological: He is alert. He has normal reflexes. No cranial nerve  deficit. He exhibits normal muscle tone. Coordination normal.  Skin: Skin is warm and dry. No rash noted. No erythema. No pallor.  Psychiatric: He has a normal mood and affect.          Assessment & Plan:

## 2012-05-09 NOTE — Assessment & Plan Note (Signed)
Trig up due to sugar likely Disc goals for lipids and reasons to control them Rev labs with pt Rev low sat fat diet in detail  Will continue to follow

## 2012-05-09 NOTE — Assessment & Plan Note (Signed)
Reviewed health habits including diet and exercise and skin cancer prevention Also reviewed health mt list, fam hx and immunizations  Flu shot today  Rev wellness lab in detail

## 2012-05-09 NOTE — Patient Instructions (Addendum)
Avoid sugar in diet as much as you can and work on weight loss Schedule non fasting lab in 3 months and then follow up for A1c Flu shot today  We will refer you to a urologist at check out for elevated psa

## 2012-05-22 ENCOUNTER — Other Ambulatory Visit: Payer: Self-pay | Admitting: Family Medicine

## 2012-05-22 ENCOUNTER — Encounter: Payer: Self-pay | Admitting: Internal Medicine

## 2012-05-22 ENCOUNTER — Telehealth: Payer: Self-pay | Admitting: Internal Medicine

## 2012-05-22 ENCOUNTER — Other Ambulatory Visit (HOSPITAL_BASED_OUTPATIENT_CLINIC_OR_DEPARTMENT_OTHER): Payer: BC Managed Care – PPO | Admitting: Lab

## 2012-05-22 ENCOUNTER — Encounter: Payer: Self-pay | Admitting: Oncology

## 2012-05-22 DIAGNOSIS — D696 Thrombocytopenia, unspecified: Secondary | ICD-10-CM

## 2012-05-22 DIAGNOSIS — D751 Secondary polycythemia: Secondary | ICD-10-CM

## 2012-05-22 DIAGNOSIS — D45 Polycythemia vera: Secondary | ICD-10-CM

## 2012-05-22 LAB — CBC WITH DIFFERENTIAL/PLATELET
BASO%: 0.5 % (ref 0.0–2.0)
Basophils Absolute: 0 10*3/uL (ref 0.0–0.1)
EOS%: 5.2 % (ref 0.0–7.0)
HCT: 50.2 % — ABNORMAL HIGH (ref 38.4–49.9)
HGB: 17.6 g/dL — ABNORMAL HIGH (ref 13.0–17.1)
MONO#: 0.7 10*3/uL (ref 0.1–0.9)
NEUT%: 61.3 % (ref 39.0–75.0)
RDW: 13.6 % (ref 11.0–14.6)
WBC: 6.1 10*3/uL (ref 4.0–10.3)
lymph#: 1.4 10*3/uL (ref 0.9–3.3)

## 2012-05-26 ENCOUNTER — Telehealth: Payer: Self-pay

## 2012-05-26 NOTE — Telephone Encounter (Signed)
Faxed denial back to pharmacy.  Patient needs office visit

## 2012-05-27 MED ORDER — LANSOPRAZOLE 30 MG PO CPDR: 30.0000 mg | DELAYED_RELEASE_CAPSULE | Freq: Every day | ORAL | Status: DC

## 2012-05-27 NOTE — Telephone Encounter (Signed)
Refill complete 

## 2012-06-19 ENCOUNTER — Telehealth: Payer: Self-pay

## 2012-06-19 NOTE — Telephone Encounter (Signed)
Left message on VM advising patient to watch News 2 in the morning or call our weather line, which I provided, to make sure we were open before coming for his 8:45am appointment.

## 2012-06-20 ENCOUNTER — Ambulatory Visit: Payer: BC Managed Care – PPO | Admitting: Internal Medicine

## 2012-07-31 ENCOUNTER — Other Ambulatory Visit (INDEPENDENT_AMBULATORY_CARE_PROVIDER_SITE_OTHER): Payer: BC Managed Care – PPO

## 2012-07-31 DIAGNOSIS — R739 Hyperglycemia, unspecified: Secondary | ICD-10-CM

## 2012-07-31 DIAGNOSIS — R7309 Other abnormal glucose: Secondary | ICD-10-CM

## 2012-08-08 ENCOUNTER — Ambulatory Visit (INDEPENDENT_AMBULATORY_CARE_PROVIDER_SITE_OTHER): Payer: BC Managed Care – PPO | Admitting: Family Medicine

## 2012-08-08 ENCOUNTER — Encounter: Payer: Self-pay | Admitting: Family Medicine

## 2012-08-08 VITALS — BP 146/96 | HR 81 | Temp 98.4°F | Ht 71.75 in | Wt 215.8 lb

## 2012-08-08 DIAGNOSIS — R7309 Other abnormal glucose: Secondary | ICD-10-CM

## 2012-08-08 DIAGNOSIS — R739 Hyperglycemia, unspecified: Secondary | ICD-10-CM

## 2012-08-08 NOTE — Assessment & Plan Note (Signed)
A1C of 5.7 with bmi of 29 and also fam hx of DM Disc risks of this and complications of DM  Disc lifestyle habits and urged to exercise an extra 30 min per day and also to get sweets and sugar drinks out of diet as much as possible  Given handout on DM meal planning -rev this in detail  Disc optimal wt  Will work on this - unsure how motivated  Will continue to follow

## 2012-08-08 NOTE — Patient Instructions (Addendum)
Watch diet and try to loose weight  Work hard on low sugar diet (also watch out for starches) Aim for exercise 5 days per week (30 minutes)  Your sugar is in pre diabetic range

## 2012-08-08 NOTE — Progress Notes (Signed)
Subjective:    Patient ID: Erik [name], male    DOB: [birth year], 57 y.o.   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 HYPERLIPIDEMIA, MILD  . ELEVATED BLOOD PRESSURE WITHOUT DIAGNOSIS OF HYPERTENSION  . HEADACHE  . HEMOCCULT POSITIVE STOOL  . Dyspnea on exertion  . Obesity  . Polycythemia  . Gastritis  . Thrombocytopenia  . Routine general medical examination at a health care facility  . Prostate cancer screening  . Hyperglycemia  . Elevated PSA   Past Medical History  Diagnosis Date  . Chronic headaches   . Allergy     seasonal  . Arthritis   . Cataract     surgery  . GERD (gastroesophageal reflux disease)     with HH  . Labile blood pressure   . Elevated liver enzymes   . Hyperlipidemia   . Eczema   . Polycythemia   . Thrombocytopenia 09/20/2011  . Colon polyps   . Diverticulosis    Past Surgical History  Procedure Laterality Date  . Cataract extraction w/ intraocular lens  implant, bilateral    . Cholecystectomy  2007  . Upper gastrointestinal endoscopy    . Knee arthroscopy      right  . Back surgery      Ruptured disk with staph infection  . Esophagogastroduodenoscopy  2001    Stricture/GERD and HH  . Abdominal US  2002     Negative  . Cervical disc surgery      with Staph infection   History  Substance Use Topics  . Smoking status: Former Smoker    Quit date: 04/16/1989  . Smokeless tobacco: Never Used  . Alcohol Use: Yes     Comment: Rare, almost none.   Family History  Problem Relation Age of Onset  . Heart disease Father     Heart problems, CABG  . Heart failure Father   . Heart disease Maternal Grandfather     MI  . Cancer Mother     breast CA   Allergies  Allergen Reactions  . Shellfish Allergy Anaphylaxis   Current Outpatient Prescriptions on File Prior to Visit  Medication Sig Dispense Refill  . EPINEPHrine (EPI-PEN) 0.3 mg/0.3 mL DEVI Inject 0.3 mLs (0.3 mg total) into the muscle once.  1 Device  3  . fluocinonide cream (LIDEX) 0.05 % Apply 1 application topically as needed.      Marland Kitchen ibuprofen (ADVIL) 200 MG tablet Take 200 mg by mouth as needed.      . lansoprazole (PREVACID) 30 MG  capsule Take 1 capsule (30 mg total) by mouth daily.  30 capsule  2   No current facility-administered medications on file prior to visit.    Review of Systems Review of Systems  Constitutional: Negative for fever, appetite change, fatigue and unexpected weight change.  Eyes: Negative for pain and visual disturbance.  Respiratory: Negative for cough and shortness of breath.   Cardiovascular: Negative for cp or palpitations    Gastrointestinal: Negative for nausea, diarrhea and constipation.  Genitourinary: Negative for urgency and frequency.  Skin: Negative for pallor or rash   Neurological: Negative for weakness, light-headedness, numbness and headaches.  Hematological: Negative for adenopathy. Does not bruise/bleed easily.  Psychiatric/Behavioral: Negative for dysphoric mood. The patient is not nervous/anxious.         Objective:   Physical Exam  Constitutional: He appears well-developed and well-nourished. No distress.  overwt and well app  HENT:  Head: Normocephalic.  Eyes: Conjunctivae  and EOM are normal. Pupils are equal, round, and reactive to light. Right eye exhibits no discharge. Left eye exhibits no discharge. No scleral icterus.  Neck: Normal range of motion. Neck supple. Carotid bruit is not present.  Cardiovascular: Normal rate and regular rhythm.   Pulmonary/Chest: Effort normal and breath sounds normal. No respiratory distress. He has no wheezes.  Abdominal: He exhibits no abdominal bruit.  Musculoskeletal: He exhibits no edema.  Lymphadenopathy:    He has no cervical adenopathy.  Neurological: He is alert. He has normal reflexes.  Skin: Skin is warm and dry.  Psychiatric: He has a normal mood and affect.          Assessment & Plan:

## 2012-09-19 ENCOUNTER — Telehealth: Payer: Self-pay | Admitting: Oncology

## 2012-09-19 ENCOUNTER — Other Ambulatory Visit (HOSPITAL_BASED_OUTPATIENT_CLINIC_OR_DEPARTMENT_OTHER): Payer: BC Managed Care – PPO | Admitting: Lab

## 2012-09-19 ENCOUNTER — Encounter: Payer: Self-pay | Admitting: Oncology

## 2012-09-19 ENCOUNTER — Ambulatory Visit (HOSPITAL_BASED_OUTPATIENT_CLINIC_OR_DEPARTMENT_OTHER): Payer: BC Managed Care – PPO | Admitting: Oncology

## 2012-09-19 VITALS — BP 143/93 | HR 81 | Temp 96.9°F | Resp 18 | Ht 71.0 in | Wt 213.7 lb

## 2012-09-19 DIAGNOSIS — K219 Gastro-esophageal reflux disease without esophagitis: Secondary | ICD-10-CM

## 2012-09-19 DIAGNOSIS — D696 Thrombocytopenia, unspecified: Secondary | ICD-10-CM

## 2012-09-19 DIAGNOSIS — D45 Polycythemia vera: Secondary | ICD-10-CM

## 2012-09-19 DIAGNOSIS — D751 Secondary polycythemia: Secondary | ICD-10-CM

## 2012-09-19 LAB — CBC WITH DIFFERENTIAL/PLATELET
Basophils Absolute: 0.1 10*3/uL (ref 0.0–0.1)
Eosinophils Absolute: 0.2 10*3/uL (ref 0.0–0.5)
HGB: 17.3 g/dL — ABNORMAL HIGH (ref 13.0–17.1)
MCV: 80.9 fL (ref 79.3–98.0)
MONO#: 0.6 10*3/uL (ref 0.1–0.9)
MONO%: 9.3 % (ref 0.0–14.0)
NEUT#: 4 10*3/uL (ref 1.5–6.5)
Platelets: 116 10*3/uL — ABNORMAL LOW (ref 140–400)
RBC: 6.07 10*6/uL — ABNORMAL HIGH (ref 4.20–5.82)
RDW: 13.6 % (ref 11.0–14.6)
WBC: 6 10*3/uL (ref 4.0–10.3)

## 2012-09-19 NOTE — Telephone Encounter (Signed)
gv and printed appt sched and avs for pt  °

## 2012-09-19 NOTE — Progress Notes (Signed)
[facility]  [phone] [phone]   OFFICE PROGRESS NOTE   Cc:  Roxy Manns, MD  DIAGNOSIS: Polycythemia and thrombocytopenia  CURRENT THERAPY: Watchful observation  INTERVAL HISTORY: Erik [name] 57 y.o. male returns for routine followup by himself. He continues to do well and has no complaints. He continues to work full-time. He has mild fatigue. Denies chest pain, shortness of breath, abdominal pain, nausea, vomiting, hematochezia, melena, anorexia, weight loss. Denies rashes and edema.  Patient denies headache, visual changes, confusion, drenching night sweats, palpable lymph node swelling, mucositis, odynophagia, dysphagia, nausea vomiting, jaundice, chest pain, palpitation, shortness of breath, dyspnea on exertion, productive cough, gum bleeding, epistaxis, hematemesis, hemoptysis, abdominal pain, abdominal swelling, early satiety, melena, hematochezia, hematuria, skin rash, spontaneous bleeding, joint swelling, joint pain, heat or cold intolerance, bowel bladder incontinence, back pain, focal motor weakness, paresthesia, depression, suicidal or homocidal ideation, feeling hopelessness.   Past Medical History  Diagnosis Date  . Chronic headaches   . Allergy     seasonal  . Arthritis   . Cataract     surgery  . GERD (gastroesophageal reflux disease)     with HH  . Labile blood pressure   . Elevated liver enzymes   . Hyperlipidemia   . Eczema   . Polycythemia   . Thrombocytopenia 09/20/2011  . Colon polyps   . Diverticulosis     Past Surgical History  Procedure Laterality Date  . Cataract extraction w/ intraocular lens  implant, bilateral    . Cholecystectomy  2007  . Upper gastrointestinal endoscopy    . Knee arthroscopy      right  . Back surgery      Ruptured disk with staph infection  . Esophagogastroduodenoscopy  2001    Stricture/GERD and HH  . Abdominal US  2002    Negative  . Cervical disc surgery      with Staph  infection    Current Outpatient Prescriptions  Medication Sig Dispense Refill  . EPINEPHrine (EPI-PEN) 0.3 mg/0.3 mL DEVI Inject 0.3 mLs (0.3 mg total) into the muscle once.  1 Device  3  . fluocinonide cream (LIDEX) 0.05 % Apply 1 application topically as needed.      Marland Kitchen ibuprofen (ADVIL) 200 MG tablet Take 200 mg by mouth as needed.      . lansoprazole (PREVACID) 30 MG capsule Take 1 capsule (30 mg total) by mouth daily.  30 capsule  2   No current facility-administered medications for this visit.    ALLERGIES:  is allergic to shellfish allergy.  REVIEW OF SYSTEMS:  The rest of the 14-point review of system was negative.   Filed Vitals:   09/19/12 0932  BP: 143/93  Pulse: 81  Temp: 96.9 F (36.1 C)  Resp: 18   Wt Readings from Last 3 Encounters:  09/19/12 213 lb 11.2 oz (96.934 kg)  08/08/12 215 lb 12 oz (97.864 kg)  05/09/12 211 lb 12 oz (96.049 kg)   ECOG Performance status: 0  PHYSICAL EXAMINATION:   General:  well-nourished in no acute distress.  Eyes:  no scleral icterus.  ENT:  There were no oropharyngeal lesions.  Neck was without thyromegaly.  Lymphatics:  Negative cervical, supraclavicular or axillary adenopathy.  Respiratory: lungs were clear bilaterally without wheezing or crackles.  Cardiovascular:  Regular rate and rhythm, S1/S2, without murmur, rub or gallop.  There was no pedal edema.  GI:  abdomen was soft, flat, nontender, nondistended, without organomegaly.  Muscoloskeletal:  no spinal tenderness of palpation of vertebral spine.  Skin exam was without echymosis, petichae.  Neuro exam was nonfocal.  Patient was able to get on and off exam table without assistance.  Gait was normal.  Patient was alerted and oriented.  Attention was good.   Language was appropriate.  Mood was normal without depression.  Speech was not pressured.  Thought content was not tangential.     LABORATORY/RADIOLOGY DATA:  Lab Results  Component Value Date   WBC 6.0 09/19/2012   HGB  17.3* 09/19/2012   HCT 49.2 09/19/2012   PLT 116* 09/19/2012   GLUCOSE 135* 05/02/2012   CHOL 158 05/02/2012   TRIG 204.0* 05/02/2012   HDL 30.90* 05/02/2012   LDLDIRECT 75.6 05/02/2012   LDLCALC 77 06/05/2010   ALKPHOS 134* 05/02/2012   ALT 34 05/02/2012   AST 31 05/02/2012   NA 139 05/02/2012   K 4.3 05/02/2012   CL 101 05/02/2012   CREATININE 1.0 05/02/2012   BUN 17 05/02/2012   CO2 33* 05/02/2012   PSA 4.56* 05/02/2012   HGBA1C 5.7 07/31/2012   ASSESSMENT AND PLAN:   1. GERD: He is on PPI per PCP.  2. Polycythemia:  - Most likely secondary to exposure to exhaust fume at work causing relative hypoxia. JAK-2 was negative.  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 This does not increase his risk of thrombosis. Recommend watchful observation. 3. Thrombocytopenia:  - Differential diagnosis: Due to PPI vs giant platelet syndrome.  - His plt is still >100K. I discussed with patient that with plt >30K, he is not at higher risk of spontaneous hemorrhage. No indication for work up at this time. We will follow his CBC and consider further work up in the future if Plt <100K.  4. Follow up: [facility]heck CBC at the [facility] in about 4 and 8 months. Return visit in about 1 year.     The length of time of the face-to-face encounter was 15 minutes. More than 50% of time was spent counseling and coordination of care.

## 2013-01-23 ENCOUNTER — Other Ambulatory Visit (HOSPITAL_BASED_OUTPATIENT_CLINIC_OR_DEPARTMENT_OTHER): Payer: BC Managed Care – PPO

## 2013-01-23 ENCOUNTER — Telehealth: Payer: Self-pay | Admitting: *Deleted

## 2013-01-23 DIAGNOSIS — D45 Polycythemia vera: Secondary | ICD-10-CM

## 2013-01-23 DIAGNOSIS — D696 Thrombocytopenia, unspecified: Secondary | ICD-10-CM

## 2013-01-23 DIAGNOSIS — D751 Secondary polycythemia: Secondary | ICD-10-CM

## 2013-01-23 LAB — CBC WITH DIFFERENTIAL/PLATELET
Basophils Absolute: 0.1 10*3/uL (ref 0.0–0.1)
Eosinophils Absolute: 0.2 10*3/uL (ref 0.0–0.5)
HGB: 16.9 g/dL (ref 13.0–17.1)
MCV: 84.1 fL (ref 79.3–98.0)
MONO#: 0.5 10*3/uL (ref 0.1–0.9)
MONO%: 8.6 % (ref 0.0–14.0)
NEUT#: 3.5 10*3/uL (ref 1.5–6.5)
RBC: 5.84 10*6/uL — ABNORMAL HIGH (ref 4.20–5.82)
RDW: 13.3 % (ref 11.0–14.6)
WBC: 5.4 10*3/uL (ref 4.0–10.3)
lymph#: 1.1 10*3/uL (ref 0.9–3.3)

## 2013-01-23 NOTE — Telephone Encounter (Signed)
Informed pt of Kristin's message below and to keep next lab in Feb as scheduled.  Call sooner if any problems, especially unusual bleeding or bruising. He verbalized understanding.

## 2013-01-23 NOTE — Telephone Encounter (Signed)
Message copied by Wende Mott on Fri Jan 23, 2013  5:04 PM ------      Message from: Myrtis Ser      Created: Fri Jan 23, 2013 11:42 AM       Please call pt. Plt remains stable. Hgb is normal. Continue observation. ------

## 2013-05-22 ENCOUNTER — Other Ambulatory Visit (HOSPITAL_BASED_OUTPATIENT_CLINIC_OR_DEPARTMENT_OTHER): Payer: BC Managed Care – PPO

## 2013-05-22 DIAGNOSIS — D45 Polycythemia vera: Secondary | ICD-10-CM

## 2013-05-22 DIAGNOSIS — D696 Thrombocytopenia, unspecified: Secondary | ICD-10-CM

## 2013-05-22 DIAGNOSIS — D751 Secondary polycythemia: Secondary | ICD-10-CM

## 2013-05-22 LAB — CBC WITH DIFFERENTIAL/PLATELET
BASO%: 0.9 % (ref 0.0–2.0)
Basophils Absolute: 0.1 10*3/uL (ref 0.0–0.1)
EOS%: 3.6 % (ref 0.0–7.0)
Eosinophils Absolute: 0.2 10*3/uL (ref 0.0–0.5)
HCT: 49.7 % (ref 38.4–49.9)
HGB: 17.2 g/dL — ABNORMAL HIGH (ref 13.0–17.1)
LYMPH#: 1.2 10*3/uL (ref 0.9–3.3)
LYMPH%: 21 % (ref 14.0–49.0)
MCH: 29.2 pg (ref 27.2–33.4)
MCHC: 34.6 g/dL (ref 32.0–36.0)
MCV: 84.4 fL (ref 79.3–98.0)
MONO#: 0.4 10*3/uL (ref 0.1–0.9)
MONO%: 7.6 % (ref 0.0–14.0)
NEUT#: 3.7 10*3/uL (ref 1.5–6.5)
NEUT%: 66.9 % (ref 39.0–75.0)
Platelets: 134 10*3/uL — ABNORMAL LOW (ref 140–400)
RBC: 5.89 10*6/uL — ABNORMAL HIGH (ref 4.20–5.82)
RDW: 13.1 % (ref 11.0–14.6)
WBC: 5.5 10*3/uL (ref 4.0–10.3)
nRBC: 0 % (ref 0–0)

## 2013-06-10 ENCOUNTER — Other Ambulatory Visit: Payer: Self-pay | Admitting: Internal Medicine

## 2013-06-20 ENCOUNTER — Other Ambulatory Visit: Payer: Self-pay | Admitting: Internal Medicine

## 2013-06-23 ENCOUNTER — Telehealth: Payer: Self-pay | Admitting: Internal Medicine

## 2013-06-23 MED ORDER — LANSOPRAZOLE 30 MG PO CPDR: 30.0000 mg | DELAYED_RELEASE_CAPSULE | Freq: Every day | ORAL | Status: DC

## 2013-06-23 NOTE — Telephone Encounter (Signed)
Sent in 30 days worth of Prevacid. Pt will need follow up for future refills

## 2013-07-17 ENCOUNTER — Other Ambulatory Visit: Payer: Self-pay | Admitting: Internal Medicine

## 2013-07-20 ENCOUNTER — Telehealth: Payer: Self-pay | Admitting: Internal Medicine

## 2013-07-20 NOTE — Telephone Encounter (Signed)
Refilled Prevacid

## 2013-08-21 ENCOUNTER — Ambulatory Visit (INDEPENDENT_AMBULATORY_CARE_PROVIDER_SITE_OTHER): Payer: BC Managed Care – PPO | Admitting: Internal Medicine

## 2013-08-21 ENCOUNTER — Encounter: Payer: Self-pay | Admitting: Internal Medicine

## 2013-08-21 VITALS — BP 152/94 | HR 70 | Ht 73.0 in | Wt 216.0 lb

## 2013-08-21 DIAGNOSIS — K219 Gastro-esophageal reflux disease without esophagitis: Secondary | ICD-10-CM

## 2013-08-21 DIAGNOSIS — K5730 Diverticulosis of large intestine without perforation or abscess without bleeding: Secondary | ICD-10-CM

## 2013-08-21 DIAGNOSIS — K222 Esophageal obstruction: Secondary | ICD-10-CM

## 2013-08-21 MED ORDER — LANSOPRAZOLE 30 MG PO CPDR: DELAYED_RELEASE_CAPSULE | ORAL | Status: DC

## 2013-08-21 NOTE — Patient Instructions (Signed)
We sent refills to [facility] for Prevacid 30 mg.  Follow up in 2 years with Dr. Scarlette Shorts. Call in the meantime if you have any problems.

## 2013-08-21 NOTE — Progress Notes (Signed)
HISTORY OF PRESENT ILLNESS:  Erik [name] is a 58 y.o. male with gastroesophageal reflux disease complicated by esophagitis and peptic stricture requiring esophageal dilation. He also has a history of choledocholithiasis with prior ERCP and common duct stone extraction. He is status post cholecystectomy. Patient has not been in the office in some time. However, he was last seen April 2012 for screening colonoscopy and evaluation of Hemoccult-positive stool. He was found to have diminutive ascending colon polyp and moderate diverticulosis. The polyp was hyperplastic. Followup in 10 years recommended. He presents today for ongoing management of reflux. He takes lansoprazole 30 mg daily. On medication no symptoms. Off medication, significant symptoms. No recurrent dysphagia no appreciable medication side effects. Chronic medical problems are stable.  REVIEW OF SYSTEMS:  All non-GI ROS negative .  Past Medical History  Diagnosis Date  . Chronic headaches   . Allergy     seasonal  . Arthritis   . Cataract     surgery  . GERD (gastroesophageal reflux disease)     with HH  . Labile blood pressure   . Elevated liver enzymes   . Hyperlipidemia   . Eczema   . Polycythemia   . Thrombocytopenia 09/20/2011  . Colon polyps     hyperplastic  . Diverticulosis     Past Surgical History  Procedure Laterality Date  . Cataract extraction w/ intraocular lens  implant, bilateral    . Cholecystectomy  2007  . Upper gastrointestinal endoscopy    . Knee arthroscopy      right  . Back surgery      Ruptured disk with staph infection  . Esophagogastroduodenoscopy  2001    Stricture/GERD and HH  . Abdominal US  2002    Negative  . Cervical disc surgery      with Staph infection    Social History Erik [name]  reports that he quit smoking about 24 years ago. He has never used smokeless tobacco. He reports that he drinks alcohol. He reports that he does not use illicit drugs.  family history  includes Cancer in his mother; Heart disease in his father and maternal grandfather; Heart failure in his father.  Allergies  Allergen Reactions  . Shellfish Allergy Anaphylaxis       PHYSICAL EXAMINATION: Vital signs: BP 152/94  Pulse 70  Ht 6\' 1"  (1.854 m)  Wt 216 lb (97.977 kg)  BMI 28.50 kg/m2 General: Well-developed, well-nourished, no acute distress HEENT: Sclerae are anicteric, conjunctiva pink. Oral mucosa intact Lungs: Clear Heart: Regular Abdomen: soft, nontender, nondistended, no obvious ascites, no peritoneal signs, normal bowel sounds. No organomegaly. Extremities: No edema Psychiatric: alert and oriented x3. Cooperative     ASSESSMENT:  #1. GERD complicated by esophagitis and stricture requiring dilation. Currently asymptomatic post dilation on PPI #2. Screening colonoscopy April 2012. Diverticulosis and diminutive hyperplastic polyp #3. Remote choledocholithiasis status post ERCP with stone extraction and prior laparoscopic cholecystectomy   PLAN:  #1. Reflux precautions #2. Continue PPI. Lansoprazole refilled #3. Routine office followup in 2 years. Sooner if needed #4. Repeat screening colonoscopy around 2022

## 2013-09-18 ENCOUNTER — Other Ambulatory Visit (HOSPITAL_BASED_OUTPATIENT_CLINIC_OR_DEPARTMENT_OTHER): Payer: BC Managed Care – PPO

## 2013-09-18 ENCOUNTER — Ambulatory Visit (HOSPITAL_BASED_OUTPATIENT_CLINIC_OR_DEPARTMENT_OTHER): Payer: BC Managed Care – PPO | Admitting: Hematology and Oncology

## 2013-09-18 ENCOUNTER — Encounter: Payer: Self-pay | Admitting: Hematology and Oncology

## 2013-09-18 VITALS — BP 154/104 | HR 81 | Temp 97.4°F | Resp 18 | Ht 73.0 in | Wt 220.8 lb

## 2013-09-18 DIAGNOSIS — D696 Thrombocytopenia, unspecified: Secondary | ICD-10-CM

## 2013-09-18 DIAGNOSIS — D751 Secondary polycythemia: Secondary | ICD-10-CM

## 2013-09-18 DIAGNOSIS — D45 Polycythemia vera: Secondary | ICD-10-CM

## 2013-09-18 LAB — CBC WITH DIFFERENTIAL/PLATELET
BASO%: 0.7 % (ref 0.0–2.0)
Basophils Absolute: 0 10*3/uL (ref 0.0–0.1)
EOS ABS: 0.2 10*3/uL (ref 0.0–0.5)
EOS%: 2.8 % (ref 0.0–7.0)
HCT: 52.4 % — ABNORMAL HIGH (ref 38.4–49.9)
HGB: 17.6 g/dL — ABNORMAL HIGH (ref 13.0–17.1)
LYMPH#: 1.1 10*3/uL (ref 0.9–3.3)
LYMPH%: 16.2 % (ref 14.0–49.0)
MCH: 28.7 pg (ref 27.2–33.4)
MCHC: 33.6 g/dL (ref 32.0–36.0)
MCV: 85.5 fL (ref 79.3–98.0)
MONO#: 0.7 10*3/uL (ref 0.1–0.9)
MONO%: 9.7 % (ref 0.0–14.0)
NEUT%: 70.6 % (ref 39.0–75.0)
NEUTROS ABS: 4.9 10*3/uL (ref 1.5–6.5)
Platelets: 139 10*3/uL — ABNORMAL LOW (ref 140–400)
RBC: 6.13 10*6/uL — ABNORMAL HIGH (ref 4.20–5.82)
RDW: 13.4 % (ref 11.0–14.6)
WBC: 7 10*3/uL (ref 4.0–10.3)

## 2013-09-18 LAB — COMPREHENSIVE METABOLIC PANEL (CC13)
ALBUMIN: 3.7 g/dL (ref 3.5–5.0)
ALT: 35 U/L (ref 0–55)
ANION GAP: 10 meq/L (ref 3–11)
AST: 31 U/L (ref 5–34)
Alkaline Phosphatase: 186 U/L — ABNORMAL HIGH (ref 40–150)
BUN: 18.4 mg/dL (ref 7.0–26.0)
CALCIUM: 9.3 mg/dL (ref 8.4–10.4)
CO2: 25 meq/L (ref 22–29)
CREATININE: 1 mg/dL (ref 0.7–1.3)
Chloride: 105 mEq/L (ref 98–109)
GLUCOSE: 116 mg/dL (ref 70–140)
POTASSIUM: 4.3 meq/L (ref 3.5–5.1)
Sodium: 141 mEq/L (ref 136–145)
Total Bilirubin: 0.4 mg/dL (ref 0.20–1.20)
Total Protein: 6.5 g/dL (ref 6.4–8.3)

## 2013-09-18 NOTE — Assessment & Plan Note (Addendum)
Likely due to toxic inhalation of carbo monoxide at his work place. Obstructive sleep apnea cannot be ruled out. I recommend he speaks with a supervisor to see if he can work in them more open area. I also recommend weight loss. I recommend regular blood donation to keep his hemoglobin less than 16. The patient agrees not to return and just follow with his primary care provider for regular blood work. Recommend continue aspirin 81 mg to prevent risk of blood clot.

## 2013-09-18 NOTE — Progress Notes (Signed)
[facility] progress notes  Patient Care Team: Abner Greenspan, MD as PCP - General Irene Shipper, MD (Gastroenterology) Heath Lark, MD as Consulting Physician (Hematology and Oncology)  CHIEF COMPLAINTS/PURPOSE OF VISIT:  Mild thrombocytopenia and polycythemia.  HISTORY OF PRESENTING ILLNESS:  Erik [name] 58 y.o. male was transferred to my care after his prior physician has left.  I reviewed the patient's records extensive and collaborated the history with the patient. Summary of his history is as follows: He was noted to have persistent polycythemia within the past year.  Thus, he was kindly referred to the [facility] for evaluation.  The oldest CBC available on EPIC dated back to 05/03/2006 where WBC 5.2; Hgb 16.2; plt 206.  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  Blood tests for JAK2 mutation was negative. He is being observed. He donate his blood periodically. The last time he donated blood was a year ago. The patient denies any recent signs or symptoms of bleeding such as spontaneous epistaxis, hematuria or hematochezia.   MEDICAL HISTORY:  Past Medical History  Diagnosis Date  . Chronic headaches   . Allergy     seasonal  . Arthritis   . Cataract     surgery  . GERD (gastroesophageal reflux disease)     with HH  . Labile blood pressure   . Elevated liver enzymes   . Hyperlipidemia   . Eczema   . Polycythemia   . Thrombocytopenia 09/20/2011  . Colon polyps     hyperplastic  . Diverticulosis   . Hypertension     SURGICAL HISTORY: Past Surgical History  Procedure Laterality Date  . Cataract extraction w/ intraocular lens  implant, bilateral    . Cholecystectomy  2007  . Upper gastrointestinal endoscopy    . Knee arthroscopy      right  . Back surgery      Ruptured disk with staph infection  . Esophagogastroduodenoscopy  2001    Stricture/GERD and HH  . Abdominal US  2002    Negative  . Cervical  disc surgery      with Staph infection    SOCIAL HISTORY: History   Social History  . Marital Status: Married    Spouse Name: N/A    Number of Children: 1  . Years of Education: N/A   Occupational History  .      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    Social History Main Topics  . Smoking status: Former Smoker    Quit date: 04/16/1989  . Smokeless tobacco: Never Used  . Alcohol Use: Yes     Comment: Rare, almost none.  . Drug Use: No  . Sexual Activity: Yes   Other Topics Concern  . Not on file   Social History Narrative   Some exercise.  Hobby:  Hand-making bullets.    FAMILY HISTORY: Family History  Problem Relation Age of Onset  . Heart disease Father     Heart problems, CABG  . Heart failure Father   . Heart disease Maternal Grandfather     MI  . Cancer Mother     breast CA    ALLERGIES:  is allergic to shellfish allergy.  MEDICATIONS:  Current Outpatient Prescriptions  Medication Sig Dispense Refill  . aspirin 81 MG tablet Take 250 mg by mouth daily.      . fluocinonide cream (LIDEX) 1.19 % Apply 1 application topically as  needed.      Marland Kitchen ibuprofen (ADVIL) 200 MG tablet Take 200 mg by mouth as needed.      . lansoprazole (PREVACID) 30 MG capsule Take 1 capsule daily .  30 capsule  11  . EPINEPHrine (EPI-PEN) 0.3 mg/0.3 mL DEVI Inject 0.3 mLs (0.3 mg total) into the muscle once.  1 Device  3   No current facility-administered medications for this visit.    REVIEW OF SYSTEMS:   Constitutional: Denies fevers, chills or abnormal night sweats Eyes: Denies blurriness of vision, double vision or watery eyes Ears, nose, mouth, throat, and face: Denies mucositis or sore throat Respiratory: Denies cough, dyspnea or wheezes Cardiovascular: Denies palpitation, chest discomfort or lower extremity swelling Gastrointestinal:  Denies nausea, heartburn or change in bowel habits Skin: Denies abnormal skin rashes Lymphatics: Denies  new lymphadenopathy  Neurological:Denies numbness, tingling or new weaknesses Behavioral/Psych: Mood is stable, no new changes  All other systems were reviewed with the patient and are negative.  PHYSICAL EXAMINATION: ECOG PERFORMANCE STATUS: 0 - Asymptomatic  Filed Vitals:   09/18/13 0853  BP: 154/104  Pulse: 81  Temp: 97.4 F (36.3 C)  Resp: 18   Filed Weights   09/18/13 0853  Weight: 220 lb 12.8 oz (100.154 kg)    GENERAL:alert, no distress and comfortable. He is mildly obese SKIN: skin color, texture, turgor are normal, no rashes or significant lesions. He looks plethoric EYES: normal, conjunctiva are pink and non-injected, sclera clear OROPHARYNX:no exudate, normal lips, buccal mucosa, and tongue  NECK: supple, thyroid normal size, non-tender, without nodularity LYMPH:  no palpable lymphadenopathy in the cervical, axillary or inguinal LUNGS: clear to auscultation and percussion with normal breathing effort HEART: regular rate & rhythm and no murmurs without lower extremity edema ABDOMEN:abdomen soft, non-tender and normal bowel sounds. No splenomegaly Musculoskeletal:no cyanosis of digits and no clubbing  PSYCH: alert & oriented x 3 with fluent speech NEURO: no focal motor/sensory deficits  LABORATORY DATA:  I have reviewed the data as listed Lab Results  Component Value Date   WBC 7.0 09/18/2013   HGB 17.6* 09/18/2013   HCT 52.4* 09/18/2013   MCV 85.5 09/18/2013   PLT 139* 09/18/2013    Recent Labs  09/18/13 0840  NA 141  K 4.3  CO2 25  GLUCOSE 116  BUN 18.4  CREATININE 1.0  CALCIUM 9.3  PROT 6.5  ALBUMIN 3.7  AST 31  ALT 35  ALKPHOS 186*  BILITOT 0.40    ASSESSMENT & PLAN:  Polycythemia Likely due to toxic inhalation of carbo monoxide at his work place. Obstructive sleep apnea cannot be ruled out. I recommend he speaks with a supervisor to see if he can work in them more open area. I also recommend weight loss. I recommend regular blood donation to keep  his hemoglobin less than 16. The patient agrees not to return and just follow with his primary care provider for regular blood work. Recommend continue aspirin 81 mg to prevent risk of blood clot.  Thrombocytopenia Likely due to mild liver disease. I recommended avoidance of alcohol intake.      All questions were answered. The patient knows to call the clinic with any problems, questions or concerns. I spent 15 minutes counseling the patient face to face. The total time spent in the appointment was 20 minutes and more than 50% was on counseling.     Heath Lark, MD 09/18/2013 11:03 AM

## 2013-09-18 NOTE — Assessment & Plan Note (Signed)
Likely due to mild liver disease. I recommended avoidance of alcohol intake.

## 2014-08-15 ENCOUNTER — Other Ambulatory Visit: Payer: Self-pay | Admitting: Internal Medicine

## 2015-03-14 ENCOUNTER — Telehealth: Payer: Self-pay | Admitting: Family Medicine

## 2015-03-14 DIAGNOSIS — Z125 Encounter for screening for malignant neoplasm of prostate: Secondary | ICD-10-CM

## 2015-03-14 DIAGNOSIS — Z0000 Encounter for general adult medical examination without abnormal findings: Secondary | ICD-10-CM

## 2015-03-14 NOTE — Telephone Encounter (Signed)
-----   Message from Marchia Bond sent at 03/08/2015  2:37 PM EST ----- Regarding: Cpx labs Thurs 12/1, need orders. Thanks! :-) Please order  future cpx labs for pt's upcoming lab appt. Thanks Aniceto Boss

## 2015-03-17 ENCOUNTER — Other Ambulatory Visit (INDEPENDENT_AMBULATORY_CARE_PROVIDER_SITE_OTHER): Payer: BLUE CROSS/BLUE SHIELD

## 2015-03-17 DIAGNOSIS — Z0000 Encounter for general adult medical examination without abnormal findings: Secondary | ICD-10-CM

## 2015-03-17 DIAGNOSIS — Z125 Encounter for screening for malignant neoplasm of prostate: Secondary | ICD-10-CM | POA: Diagnosis not present

## 2015-03-17 LAB — CBC WITH DIFFERENTIAL/PLATELET
BASOS ABS: 0 10*3/uL (ref 0.0–0.1)
Basophils Relative: 0.4 % (ref 0.0–3.0)
Eosinophils Absolute: 0.3 10*3/uL (ref 0.0–0.7)
Eosinophils Relative: 4 % (ref 0.0–5.0)
HCT: 50.6 % (ref 39.0–52.0)
Hemoglobin: 17.2 g/dL — ABNORMAL HIGH (ref 13.0–17.0)
LYMPHS ABS: 1.3 10*3/uL (ref 0.7–4.0)
Lymphocytes Relative: 21.1 % (ref 12.0–46.0)
MCHC: 33.9 g/dL (ref 30.0–36.0)
MCV: 85.4 fl (ref 78.0–100.0)
MONOS PCT: 9.7 % (ref 3.0–12.0)
Monocytes Absolute: 0.6 10*3/uL (ref 0.1–1.0)
Neutro Abs: 4.1 10*3/uL (ref 1.4–7.7)
Neutrophils Relative %: 64.8 % (ref 43.0–77.0)
PLATELETS: 168 10*3/uL (ref 150.0–400.0)
RBC: 5.92 Mil/uL — ABNORMAL HIGH (ref 4.22–5.81)
RDW: 13.9 % (ref 11.5–15.5)
WBC: 6.3 10*3/uL (ref 4.0–10.5)

## 2015-03-17 LAB — COMPREHENSIVE METABOLIC PANEL WITH GFR
ALT: 27 U/L (ref 0–53)
AST: 27 U/L (ref 0–37)
Albumin: 3.8 g/dL (ref 3.5–5.2)
Alkaline Phosphatase: 136 U/L — ABNORMAL HIGH (ref 39–117)
BUN: 20 mg/dL (ref 6–23)
CO2: 31 meq/L (ref 19–32)
Calcium: 9.1 mg/dL (ref 8.4–10.5)
Chloride: 104 meq/L (ref 96–112)
Creatinine, Ser: 1.03 mg/dL (ref 0.40–1.50)
GFR: 78.14 mL/min
Glucose, Bld: 125 mg/dL — ABNORMAL HIGH (ref 70–99)
Potassium: 4.4 meq/L (ref 3.5–5.1)
Sodium: 141 meq/L (ref 135–145)
Total Bilirubin: 0.7 mg/dL (ref 0.2–1.2)
Total Protein: 6.2 g/dL (ref 6.0–8.3)

## 2015-03-17 LAB — LIPID PANEL
CHOL/HDL RATIO: 4
Cholesterol: 137 mg/dL (ref 0–200)
HDL: 32.5 mg/dL — AB (ref >39.00)
LDL Cholesterol: 77 mg/dL (ref 0–99)
NONHDL: 104.83
Triglycerides: 139 mg/dL (ref 0.0–149.0)
VLDL: 27.8 mg/dL (ref 0.0–40.0)

## 2015-03-17 LAB — TSH: TSH: 2.16 u[IU]/mL (ref 0.35–4.50)

## 2015-03-17 LAB — PSA: PSA: 4.62 ng/mL — AB (ref 0.10–4.00)

## 2015-03-21 ENCOUNTER — Ambulatory Visit (INDEPENDENT_AMBULATORY_CARE_PROVIDER_SITE_OTHER): Payer: BLUE CROSS/BLUE SHIELD | Admitting: Family Medicine

## 2015-03-21 ENCOUNTER — Encounter: Payer: Self-pay | Admitting: Family Medicine

## 2015-03-21 VITALS — BP 156/100 | HR 88 | Temp 98.8°F | Ht 71.5 in | Wt 222.5 lb

## 2015-03-21 DIAGNOSIS — E785 Hyperlipidemia, unspecified: Secondary | ICD-10-CM

## 2015-03-21 DIAGNOSIS — I10 Essential (primary) hypertension: Secondary | ICD-10-CM

## 2015-03-21 DIAGNOSIS — R972 Elevated prostate specific antigen [PSA]: Secondary | ICD-10-CM

## 2015-03-21 DIAGNOSIS — Z23 Encounter for immunization: Secondary | ICD-10-CM | POA: Diagnosis not present

## 2015-03-21 DIAGNOSIS — R030 Elevated blood-pressure reading, without diagnosis of hypertension: Secondary | ICD-10-CM

## 2015-03-21 DIAGNOSIS — Z0000 Encounter for general adult medical examination without abnormal findings: Secondary | ICD-10-CM | POA: Diagnosis not present

## 2015-03-21 DIAGNOSIS — D751 Secondary polycythemia: Secondary | ICD-10-CM | POA: Diagnosis not present

## 2015-03-21 DIAGNOSIS — R739 Hyperglycemia, unspecified: Secondary | ICD-10-CM

## 2015-03-21 DIAGNOSIS — Z125 Encounter for screening for malignant neoplasm of prostate: Secondary | ICD-10-CM

## 2015-03-21 DIAGNOSIS — E669 Obesity, unspecified: Secondary | ICD-10-CM

## 2015-03-21 MED ORDER — HYDROCHLOROTHIAZIDE 25 MG PO TABS: 25.0000 mg | ORAL_TABLET | Freq: Every day | ORAL | Status: DC

## 2015-03-21 NOTE — Assessment & Plan Note (Signed)
Sees Dr Risa Grill and has had prostate bx in the past Lab Results  Component Value Date   PSA 4.62* 03/17/2015   PSA 4.56* 05/02/2012   PSA 1.36 11/28/2009   ref for f/u

## 2015-03-21 NOTE — Assessment & Plan Note (Signed)
Glucose 125 fam hx of DM Disc need for low glycemic diet and wt loss to prevent DM Will check A1C at next f/u

## 2015-03-21 NOTE — Assessment & Plan Note (Signed)
New dx today  Last 3 bp have been high  Will start hctz 25 mg daily -disc poss side eff or problems  Disc diet/exercise/ need for wt loss Given info on DASH diet and HTN  F/u 1 mo  Labs reviewed

## 2015-03-21 NOTE — Progress Notes (Signed)
Pre visit review using our clinic review tool, if applicable. No additional management support is needed unless otherwise documented below in the visit note. 

## 2015-03-21 NOTE — Assessment & Plan Note (Signed)
Disc goals for lipids and reasons to control them Rev labs with pt Rev low sat fat diet in detail HDL is low -enc fish/fish oil and exercise

## 2015-03-21 NOTE — Assessment & Plan Note (Signed)
Discussed how this problem influences overall health and the risks it imposes  Reviewed plan for weight loss with lower calorie diet (via better food choices and also portion control or program like weight watchers) and exercise building up to or more than 30 minutes 5 days per week including some aerobic activity    

## 2015-03-21 NOTE — Patient Instructions (Signed)
Start HCTZ 25 mg for high blood pressure - update me if any problems or side effects  Also avoid salt and drink more water See the handout on DASH eating plan for this and weight loss  Tdap (tetanus) vaccine today If you are interested in a shingles/zoster vaccine - call your insurance to check on coverage,( you should not get it within 1 month of other vaccines) , then call us for a prescription  for it to take to a pharmacy that gives the shot , or make a nurse visit to get it here depending on your coverage   Watch sugar in diet to prevent diabetes- and work on weight loss  We will check labs at your next appointment   Stop at check out for referral to urology for prostate visit   Please donate blood-your Hemoglobin is high (not at goal) Follow up with me in about 1 month for blood pressure visit

## 2015-03-21 NOTE — Assessment & Plan Note (Signed)
Reviewed health habits including diet and exercise and skin cancer prevention Reviewed appropriate screening tests for age  Also reviewed health mt list, fam hx and immunization status , as well as social and family history   See HPI Labs reviewed Start HCTZ 25 mg for high blood pressure - update me if any problems or side effects  Also avoid salt and drink more water See the handout on DASH eating plan for this and weight loss  Tdap (tetanus) vaccine today If you are interested in a shingles/zoster vaccine - call your insurance to check on coverage,( you should not get it within 1 month of other vaccines) , then call us for a prescription  for it to take to a pharmacy that gives the shot , or make a nurse visit to get it here depending on your coverage   Watch sugar in diet to prevent diabetes- and work on weight loss  We will check labs at your next appointment   Stop at check out for referral to urology for prostate visit   Please donate blood-your Hemoglobin is high (not at goal) Follow up with me in about 1 month for blood pressure visit

## 2015-03-21 NOTE — Assessment & Plan Note (Signed)
Lab Results  Component Value Date   PSA 4.62* 03/17/2015   PSA 4.56* 05/02/2012   PSA 1.36 11/28/2009   Ref to urol for yearly f/u No new symptoms

## 2015-03-21 NOTE — Progress Notes (Signed)
Subjective:    Patient ID: Erik [name], male    DOB: 1955-09-13, 59 y.o.   MRN: [id number]  HPI Here for health maintenance exam and to review chronic medical problems    Working a lot  Feeling fairly good /about the same  Working long hours -this interferes with ability to care for himself  Has to work 10 more years to retire -would like to at [employer] is up 2 lb with bmi of 30  Still on obese range Trying to watch his diet (reduce junk and snacks- then occ has to eat for convenience) Cannot keep up an exercise plan due to schedule -not enough time   Feels better when he plays paint ball (it is good exercise)   Active job and he is not motivated to exercise   Knees bother him some  Had a swollen elbow on L - it got better  occ back pain    Screen Hep C/HIV Declines -not high risk    Td 1/ 06 due - will do that today  Zoster vaccine -not sure if interested in it = will check on coverage  Had flu shot here today  Colonoscopy 4/12 -hyperplastic polyp   ETOH intake -almost none  4 beers in the past year and one mixed drink Lab Results  Component Value Date   ALT 27 03/17/2015   AST 27 03/17/2015   ALKPHOS 136* 03/17/2015   BILITOT 0.7 03/17/2015   alk phos is improved  Prostate Lab Results  Component Value Date   PSA 4.62* 03/17/2015   PSA 4.56* 05/02/2012   PSA 1.36 11/28/2009   has seen urology and had a bx  Time to get back for a visit (sees Dr Risa Grill)  Hyperglycemia Glucose 125 fasting  Wants to loose weight  Diet is not optimal  Last few mo -drinking more sweet tea and sugar beverages   Alk phos improved at 136  Lipids Lab Results  Component Value Date   CHOL 137 03/17/2015   CHOL 158 05/02/2012   CHOL 138 06/05/2010   Lab Results  Component Value Date   HDL 32.50* 03/17/2015   HDL 30.90* 05/02/2012   HDL 31.00* 06/05/2010   Lab Results  Component Value Date   LDLCALC 77 03/17/2015   LDLCALC 77 06/05/2010   LDLCALC 92 11/28/2009     Lab Results  Component Value Date   TRIG 139.0 03/17/2015   TRIG 204.0* 05/02/2012   TRIG 152.0* 06/05/2010   Lab Results  Component Value Date   CHOLHDL 4 03/17/2015   CHOLHDL 5 05/02/2012   CHOLHDL 4 06/05/2010   Lab Results  Component Value Date   LDLDIRECT 75.6 05/02/2012   this is stable  HDL is low - despite active job and eating fish    Polycythemia Stable Lab Results  Component Value Date   WBC 6.3 03/17/2015   HGB 17.2* 03/17/2015   HCT 50.6 03/17/2015   MCV 85.4 03/17/2015   PLT 168.0 03/17/2015   platelets are nl  Hb is stable  Needs to donate blood   bp is high BP Readings from Last 3 Encounters:  03/21/15 156/100  09/18/13 154/104  08/21/13 152/94   staying high  Is time to start bp med   Patient Active Problem List   Diagnosis Date Noted  . Hyperglycemia 05/09/2012  . Elevated PSA 05/09/2012  . Routine general medical examination at a health care facility 05/01/2012  . Prostate cancer screening  05/01/2012  . Thrombocytopenia ([place]) 09/20/2011  . Gastritis 09/08/2011  . Polycythemia 08/29/2011  . Obesity 08/01/2011  . Dyspnea on exertion 06/01/2011  . HEMOCCULT POSITIVE STOOL 06/23/2010  . HEADACHE 06/12/2010  . Hyperlipidemia 02/28/2009  . ELEVATED BLOOD PRESSURE WITHOUT DIAGNOSIS OF HYPERTENSION 02/28/2009   Past Medical History  Diagnosis Date  . Chronic headaches   . Allergy     seasonal  . Arthritis   . Cataract     surgery  . GERD (gastroesophageal reflux disease)     with HH  . Labile blood pressure   . Elevated liver enzymes   . Hyperlipidemia   . Eczema   . Polycythemia   . Thrombocytopenia ([place]) 09/20/2011  . Colon polyps     hyperplastic  . Diverticulosis   . Hypertension    Past Surgical History  Procedure Laterality Date  . Cataract extraction w/ intraocular lens  implant, bilateral    . Cholecystectomy  2007  . Upper gastrointestinal endoscopy    . Knee arthroscopy      right  . Back surgery      Ruptured  disk with staph infection  . Esophagogastroduodenoscopy  2001    Stricture/GERD and HH  . Abdominal US  2002    Negative  . Cervical disc surgery      with Staph infection   Social History  Substance Use Topics  . Smoking status: Former Smoker    Quit date: 04/16/1989  . Smokeless tobacco: Never Used  . Alcohol Use: 0.0 oz/week    0 Standard drinks or equivalent per week     Comment: Rare, almost none.   Family History  Problem Relation Age of Onset  . Heart disease Father     Heart problems, CABG  . Heart failure Father   . Heart disease Maternal Grandfather     MI  . Cancer Mother     breast CA   Allergies  Allergen Reactions  . Shellfish Allergy Anaphylaxis   Current Outpatient Prescriptions on File Prior to Visit  Medication Sig Dispense Refill  . aspirin 81 MG tablet Take 250 mg by mouth daily.    Marland Kitchen EPINEPHrine (EPI-PEN) 0.3 mg/0.3 mL DEVI Inject 0.3 mLs (0.3 mg total) into the muscle once. 1 Device 3  . fluocinonide cream (LIDEX) 3.66 % Apply 1 application topically as needed.    Marland Kitchen ibuprofen (ADVIL) 200 MG tablet Take 200 mg by mouth as needed.    . lansoprazole (PREVACID) 30 MG capsule take 1 capsule by mouth once daily 30 capsule 11   No current facility-administered medications on file prior to visit.      Review of Systems Review of Systems  Constitutional: Negative for fever, appetite change, fatigue and unexpected weight change.  Eyes: Negative for pain and visual disturbance.  Respiratory: Negative for cough and shortness of breath.  pos for generally poor exercise tolerance  Cardiovascular: Negative for cp or palpitations    Gastrointestinal: Negative for nausea, diarrhea and constipation.  Genitourinary: Negative for urgency and frequency.  Skin: Negative for pallor or rash   MSK pos for aches and pains  Neurological: Negative for weakness, light-headedness, numbness and headaches.  Hematological: Negative for adenopathy. Does not bruise/bleed  easily.  Psychiatric/Behavioral: Negative for dysphoric mood. The patient is not nervous/anxious.         Objective:   Physical Exam  Constitutional: He appears well-developed and well-nourished. No distress.  obese and well appearing   HENT:  Head: Normocephalic and  atraumatic.  Right Ear: External ear normal.  Left Ear: External ear normal.  Nose: Nose normal.  Mouth/Throat: Oropharynx is clear and moist.  Eyes: Conjunctivae and EOM are normal. Pupils are equal, round, and reactive to light. Right eye exhibits no discharge. Left eye exhibits no discharge. No scleral icterus.  Neck: Normal range of motion. Neck supple. No JVD present. Carotid bruit is not present. No thyromegaly present.  Cardiovascular: Normal rate, regular rhythm, normal heart sounds and intact distal pulses.  Exam reveals no gallop.   Pulmonary/Chest: Effort normal and breath sounds normal. No respiratory distress. He has no wheezes. He exhibits no tenderness.  Abdominal: Soft. Bowel sounds are normal. He exhibits no distension, no abdominal bruit and no mass. There is no tenderness.  Musculoskeletal: He exhibits no edema or tenderness.  Lymphadenopathy:    He has no cervical adenopathy.  Neurological: He is alert. He has normal reflexes. No cranial nerve deficit. He exhibits normal muscle tone. Coordination normal.  Skin: Skin is warm and dry. No rash noted. No erythema. No pallor.  Few skin tags and SKs  Psychiatric: He has a normal mood and affect.          Assessment & Plan:   Problem List Items Addressed This Visit      Cardiovascular and Mediastinum   Essential hypertension    New dx today  Last 3 bp have been high  Will start hctz 25 mg daily -disc poss side eff or problems  Disc diet/exercise/ need for wt loss Given info on DASH diet and HTN  F/u 1 mo  Labs reviewed        Relevant Medications   hydrochlorothiazide (HYDRODIURIL) 25 MG tablet     Other   RESOLVED: ELEVATED BLOOD  PRESSURE WITHOUT DIAGNOSIS OF HYPERTENSION   Elevated PSA    Sees Dr Risa Grill and has had prostate bx in the past Lab Results  Component Value Date   PSA 4.62* 03/17/2015   PSA 4.56* 05/02/2012   PSA 1.36 11/28/2009   ref for f/u      Relevant Orders   Ambulatory referral to Urology   Hyperglycemia    Glucose 125 fam hx of DM Disc need for low glycemic diet and wt loss to prevent DM Will check A1C at next f/u      Hyperlipidemia    Disc goals for lipids and reasons to control them Rev labs with pt Rev low sat fat diet in detail HDL is low -enc fish/fish oil and exercise       Relevant Medications   hydrochlorothiazide (HYDRODIURIL) 25 MG tablet   Obesity    Discussed how this problem influences overall health and the risks it imposes  Reviewed plan for weight loss with lower calorie diet (via better food choices and also portion control or program like weight watchers) and exercise building up to or more than 30 minutes 5 days per week including some aerobic activity         Polycythemia    2ndary to carbon monoxide exp at work  Hb is stable above 17 inst pt to donate blood more often- he voiced understanding  F/u hematology as needed       Prostate cancer screening    Lab Results  Component Value Date   PSA 4.62* 03/17/2015   PSA 4.56* 05/02/2012   PSA 1.36 11/28/2009   Ref to urol for yearly f/u No new symptoms       Routine general medical examination  at a health care facility - Primary    Reviewed health habits including diet and exercise and skin cancer prevention Reviewed appropriate screening tests for age  Also reviewed health mt list, fam hx and immunization status , as well as social and family history   See HPI Labs reviewed Start HCTZ 25 mg for high blood pressure - update me if any problems or side effects  Also avoid salt and drink more water See the handout on DASH eating plan for this and weight loss  Tdap (tetanus) vaccine today If you  are interested in a shingles/zoster vaccine - call your insurance to check on coverage,( you should not get it within 1 month of other vaccines) , then call us for a prescription  for it to take to a pharmacy that gives the shot , or make a nurse visit to get it here depending on your coverage   Watch sugar in diet to prevent diabetes- and work on weight loss  We will check labs at your next appointment   Stop at check out for referral to urology for prostate visit   Please donate blood-your Hemoglobin is high (not at goal) Follow up with me in about 1 month for blood pressure visit        Other Visit Diagnoses    Need for influenza vaccination        Relevant Orders    Flu Vaccine QUAD 36+ mos PF IM (Fluarix & Fluzone Quad PF) (Completed)    Need for Tdap vaccination        Relevant Orders    Tdap vaccine greater than or equal to 7yo IM (Completed)

## 2015-03-21 NOTE — Assessment & Plan Note (Signed)
2ndary to carbon monoxide exp at work  Hb is stable above 17 inst pt to donate blood more often- he voiced understanding  F/u hematology as needed

## 2015-04-12 ENCOUNTER — Encounter: Payer: Self-pay | Admitting: Internal Medicine

## 2015-04-12 ENCOUNTER — Ambulatory Visit (INDEPENDENT_AMBULATORY_CARE_PROVIDER_SITE_OTHER): Payer: BLUE CROSS/BLUE SHIELD | Admitting: Internal Medicine

## 2015-04-12 VITALS — BP 130/90 | HR 96 | Ht 72.0 in | Wt 220.4 lb

## 2015-04-12 DIAGNOSIS — K2100 Gastro-esophageal reflux disease with esophagitis, without bleeding: Secondary | ICD-10-CM

## 2015-04-12 DIAGNOSIS — K222 Esophageal obstruction: Secondary | ICD-10-CM | POA: Diagnosis not present

## 2015-04-12 MED ORDER — LANSOPRAZOLE 30 MG PO CPDR: 30.0000 mg | DELAYED_RELEASE_CAPSULE | Freq: Every day | ORAL | Status: DC

## 2015-04-12 NOTE — Patient Instructions (Signed)
We have sent medications to your pharmacy for you to pick up at your convenience.  Please return to see Dr Henrene Pastor in 2 years.

## 2015-04-12 NOTE — Progress Notes (Signed)
HISTORY OF PRESENT ILLNESS:  Erik [name] is a 59 y.o. male with gastroesophageal reflux disease complicated by esophagitis and peptic stricture requiring esophageal dilation. He also has a history of choledocholithiasis with prior ERCP and common duct stone extraction. He is status post cholecystectomy. He also underwent screening colonoscopy April 2012 with moderate diverticulosis and hyperplastic polyp only. He was last evaluated in the office 08/21/2013 for ongoing management of chronic GERD. Asymptomatic post dilation at that time on lansoprazole 30 mg daily. He presents today for routine follow-up. He does report that 2-3 months ago he did experience several days of breakthrough heartburn. Managed conservatively. No problems since. He does continue on lansoprazole 30 mg daily. He is had no recurrent dysphagia. He has had modest weight gain of about 5 pounds. No lower GI complaints. He does request medication refill  REVIEW OF SYSTEMS:  All non-GI ROS negative except for sinus and allergy, headaches, skin rash, sore throat, feet swelling  Past Medical History  Diagnosis Date  . Chronic headaches   . Allergy     seasonal  . Arthritis   . Cataract     surgery  . GERD (gastroesophageal reflux disease)     with HH  . Labile blood pressure   . Elevated liver enzymes   . Hyperlipidemia   . Eczema   . Polycythemia   . Thrombocytopenia ([place]) 09/20/2011  . Colon polyps     hyperplastic  . Diverticulosis   . Hypertension   . Esophageal stricture     Past Surgical History  Procedure Laterality Date  . Cataract extraction w/ intraocular lens  implant, bilateral    . Cholecystectomy  2007  . Upper gastrointestinal endoscopy    . Knee arthroscopy      right  . Back surgery      Ruptured disk with staph infection  . Esophagogastroduodenoscopy  2001    Stricture/GERD and HH  . Abdominal US  2002    Negative  . Cervical disc surgery      with Staph infection    Social  History Erik [name]  reports that he quit smoking about 26 years ago. He has never used smokeless tobacco. He reports that he drinks alcohol. He reports that he does not use illicit drugs.  family history includes Cancer in his mother; Heart disease in his father and maternal grandfather; Heart failure in his father.  Allergies  Allergen Reactions  . Shellfish Allergy Anaphylaxis       PHYSICAL EXAMINATION: Vital signs: BP 130/90 mmHg  Pulse 96  Ht 6' (1.829 m)  Wt 220 lb 6 oz (99.961 kg)  BMI 29.88 kg/m2 General: Well-developed, well-nourished, no acute distress HEENT: Sclerae are anicteric, conjunctiva pink. Oral mucosa intact Lungs: Clear Heart: Regular Abdomen: soft, nontender, nondistended, no obvious ascites, no peritoneal signs, normal bowel sounds. No organomegaly. Extremities: No clubbing cyanosis or edema Psychiatric: alert and oriented x3. Cooperative   ASSESSMENT:  #1. GERD complicated by erosive esophagitis and peptic stricture that required dilation. For the most part he has been doing well post dilation on PPI. Recent problems with transient breakthrough symptoms likely related to weight gain. Currently asymptomatic. #2. Remote choledocholithiasis status post ERCP with stone extraction #3. Screening colonoscopy April 2012 with diverticulosis and diminutive hyperplastic polyp   PLAN:  #1. Reflux precautions with attention to weight loss #2. Continue lansoprazole 30 mg daily. Prescription refilled #3. Routine GI office follow-up in 2 years. Contact the office in the interim for questions  or problems such as breakthrough reflux symptoms not responding to medical therapy or recurrent dysphagia #4. Repeat screening colonoscopy tentatively planned for 2022  20 minutes was spent face-to-face with the patient. Greater than 50% of the time use for counseling regarding his chronic GERD

## 2015-10-24 ENCOUNTER — Ambulatory Visit (INDEPENDENT_AMBULATORY_CARE_PROVIDER_SITE_OTHER): Payer: BLUE CROSS/BLUE SHIELD | Admitting: Family Medicine

## 2015-10-24 ENCOUNTER — Encounter: Payer: Self-pay | Admitting: Family Medicine

## 2015-10-24 VITALS — BP 142/94 | HR 90 | Temp 97.8°F | Wt 228.0 lb

## 2015-10-24 DIAGNOSIS — T148 Other injury of unspecified body region: Secondary | ICD-10-CM | POA: Diagnosis not present

## 2015-10-24 DIAGNOSIS — W57XXXA Bitten or stung by nonvenomous insect and other nonvenomous arthropods, initial encounter: Secondary | ICD-10-CM | POA: Diagnosis not present

## 2015-10-24 MED ORDER — DOXYCYCLINE HYCLATE 100 MG PO TABS: 100.0000 mg | ORAL_TABLET | Freq: Two times a day (BID) | ORAL | Status: DC

## 2015-10-24 MED ORDER — EPINEPHRINE 0.3 MG/0.3ML IJ SOAJ: 0.3000 mg | Freq: Once | INTRAMUSCULAR | Status: DC

## 2015-10-24 NOTE — Assessment & Plan Note (Signed)
Low risk, not engorged.  D/w pt.  Lab eval likely not useful at this point; doxy start not indicated now.  D/w pt.  He agrees.   Risk of med at this point likely (ie potential ADE) >> benefit, d/w pt.   Should he have other sx, then would be reasonable at that point to start med.   Well appearing.

## 2015-10-24 NOTE — Patient Instructions (Signed)
If fever, rash, or atypical aches, then start the doxycycline and update [place].   Don't start it otherwise.  Sun caution on the antibiotics.   Take care.  Glad to see you.

## 2015-10-24 NOTE — Progress Notes (Signed)
Pre visit review using our clinic review tool, if applicable. No additional management support is needed unless otherwise documented below in the visit note.  Needed refill on epipen.  Done at [facility].    Noted tick bite yesterday.  Attached.  Removed.  The tick wasn't engorged, it was still flat.  No FCNAVD.  No rash.    Meds, vitals, and allergies reviewed.   ROS: Per HPI unless specifically indicated in ROS section   nad rrr ctab R lower back with healing tick bite site, <<1cm, no FB noted.  No rash, no spreading redness.  No fluctuance.

## 2015-10-31 ENCOUNTER — Ambulatory Visit: Payer: BLUE CROSS/BLUE SHIELD | Admitting: Family Medicine

## 2016-03-09 ENCOUNTER — Other Ambulatory Visit: Payer: Self-pay | Admitting: Family Medicine

## 2016-03-13 NOTE — Telephone Encounter (Signed)
Called pt and voicemail box was full (couldn't leave a VM)

## 2016-03-13 NOTE — Telephone Encounter (Signed)
Please schedule annual exam for spring and refill until then

## 2016-03-13 NOTE — Telephone Encounter (Signed)
No recent CPE or f/u and no future appts., please advise

## 2016-03-16 NOTE — Telephone Encounter (Signed)
done

## 2016-03-16 NOTE — Telephone Encounter (Signed)
Patient scheduled physical with Dr.Tower on 08/03/16.  Please refill medication.

## 2016-03-23 DIAGNOSIS — D1801 Hemangioma of skin and subcutaneous tissue: Secondary | ICD-10-CM | POA: Diagnosis not present

## 2016-03-23 DIAGNOSIS — L814 Other melanin hyperpigmentation: Secondary | ICD-10-CM | POA: Diagnosis not present

## 2016-03-23 DIAGNOSIS — L820 Inflamed seborrheic keratosis: Secondary | ICD-10-CM | POA: Diagnosis not present

## 2016-03-23 DIAGNOSIS — L821 Other seborrheic keratosis: Secondary | ICD-10-CM | POA: Diagnosis not present

## 2016-03-23 DIAGNOSIS — D235 Other benign neoplasm of skin of trunk: Secondary | ICD-10-CM | POA: Diagnosis not present

## 2016-05-03 ENCOUNTER — Ambulatory Visit (HOSPITAL_COMMUNITY)
Admission: EM | Admit: 2016-05-03 | Discharge: 2016-05-03 | Disposition: A | Discharge status: Home / Self Care | Payer: BLUE CROSS/BLUE SHIELD | Attending: Emergency Medicine | Admitting: Emergency Medicine

## 2016-05-03 ENCOUNTER — Encounter (HOSPITAL_COMMUNITY): Payer: Self-pay | Admitting: Emergency Medicine

## 2016-05-03 DIAGNOSIS — J40 Bronchitis, not specified as acute or chronic: Secondary | ICD-10-CM

## 2016-05-03 MED ORDER — ALBUTEROL SULFATE HFA 108 (90 BASE) MCG/ACT IN AERS: 2.0000 | INHALATION_SPRAY | RESPIRATORY_TRACT | 0 refills | Status: DC | PRN

## 2016-05-03 MED ORDER — BENZONATATE 100 MG PO CAPS: 100.0000 mg | ORAL_CAPSULE | Freq: Three times a day (TID) | ORAL | 0 refills | Status: DC

## 2016-05-03 MED ORDER — AZITHROMYCIN 250 MG PO TABS: ORAL_TABLET | ORAL | 0 refills | Status: DC

## 2016-05-03 MED ORDER — PREDNISONE 50 MG PO TABS: ORAL_TABLET | ORAL | 0 refills | Status: DC

## 2016-05-03 NOTE — ED Triage Notes (Signed)
Pt c/o cold sx onset: 7 days  Sx include: fatigue, BAs, ST, nasal congestion/drainage, PND, prod cough  Denies: fevers  Taking: Nyquil, dayquil, Mucinex w/temp relief.   A&O x4... NAD

## 2016-05-03 NOTE — ED Provider Notes (Signed)
[name]    CSN: [id number] Arrival date & time: 05/03/16  1304     History   Chief Complaint Chief Complaint  Patient presents with  . URI    HPI Erik [name] is a 61 y.o. male.   HPI He is a 61 year old man here with his wife for evaluation of cough. His symptoms started about a week ago. He reports a little bit of runny nose, sore throat, and cough. The cough is described as very harsh and spasmodic. It will make him short of breath. Things seem to be getting better earlier this week, but then worsened again on Tuesday evening. He describes low-grade fevers at home. No nausea or vomiting. His appetite is decreased, but he is doing well with fluids. No history of asthma or COPD. He is a nonsmoker.  Past Medical History:  Diagnosis Date  . Allergy    seasonal  . Arthritis   . Cataract    surgery  . Chronic headaches   . Colon polyps    hyperplastic  . Diverticulosis   . Eczema   . Elevated liver enzymes   . Esophageal stricture   . GERD (gastroesophageal reflux disease)    with HH  . Hyperlipidemia   . Hypertension   . Labile blood pressure   . Polycythemia   . Thrombocytopenia ([place]) 09/20/2011    Patient Active Problem List   Diagnosis Date Noted  . Tick bite 10/24/2015  . Essential hypertension 03/21/2015  . Hyperglycemia 05/09/2012  . Elevated PSA 05/09/2012  . Routine general medical examination at a health care facility 05/01/2012  . Prostate cancer screening 05/01/2012  . Thrombocytopenia ([place]) 09/20/2011  . Gastritis 09/08/2011  . Polycythemia 08/29/2011  . Obesity 08/01/2011  . Dyspnea on exertion 06/01/2011  . HEMOCCULT POSITIVE STOOL 06/23/2010  . HEADACHE 06/12/2010  . Hyperlipidemia 02/28/2009    Past Surgical History:  Procedure Laterality Date  . Abdominal US  2002   Negative  . BACK SURGERY     Ruptured disk with staph infection  . CATARACT EXTRACTION W/ INTRAOCULAR LENS  IMPLANT, BILATERAL    . CERVICAL DISC SURGERY      with Staph infection  . CHOLECYSTECTOMY  2007  . ESOPHAGOGASTRODUODENOSCOPY  2001   Stricture/GERD and HH  . KNEE ARTHROSCOPY     right  . UPPER GASTROINTESTINAL ENDOSCOPY         Home Medications    Prior to Admission medications   Medication Sig Start Date End Date Taking? Authorizing Provider  aspirin 81 MG tablet Take 81 mg by mouth daily.    Yes Historical Provider, MD  fluocinonide cream (LIDEX) [id number] % Apply 1 application topically as needed.   Yes Historical Provider, MD  hydrochlorothiazide (HYDRODIURIL) 25 MG tablet take 1 tablet by mouth once daily 03/16/16  Yes Abner Greenspan, MD  lansoprazole (PREVACID) 30 MG capsule Take 1 capsule (30 mg total) by mouth daily. 04/12/15  Yes Irene Shipper, MD  albuterol (PROVENTIL HFA;VENTOLIN HFA) 108 (90 Base) MCG/ACT inhaler Inhale 2 puffs into the lungs every 4 (four) hours as needed for wheezing or shortness of breath. 05/03/16   Melony Overly, MD  azithromycin (ZITHROMAX Z-PAK) 250 MG tablet Take 2 pills today, then 1 pill daily until gone. 05/03/16   Melony Overly, MD  benzonatate (TESSALON) 100 MG capsule Take 1 capsule (100 mg total) by mouth every 8 (eight) hours. 05/03/16   Melony Overly, MD  EPINEPHrine 0.3 mg/0.3  mL IJ SOAJ injection Inject 0.3 mLs (0.3 mg total) into the muscle once. 10/24/15   Tonia Ghent, MD  ibuprofen (ADVIL) 200 MG tablet Take 200 mg by mouth as needed.    Historical Provider, MD  predniSONE (DELTASONE) 50 MG tablet Take 1 pill daily for 5 days. 05/03/16   Melony Overly, MD    Family History Family History  Problem Relation Age of Onset  . Heart disease Father     Heart problems, CABG  . Heart failure Father   . Cancer Mother     breast CA  . Heart disease Maternal Grandfather     MI    Social History Social History  Substance Use Topics  . Smoking status: Former Smoker    Quit date: 04/16/1989  . Smokeless tobacco: Never Used  . Alcohol use 0.0 oz/week     Comment: Rare, almost none.      Allergies   Shellfish allergy   Review of Systems Review of Systems As in history of present illness  Physical Exam Triage Vital Signs ED Triage Vitals  Enc Vitals Group     BP 05/03/16 1358 147/91     Pulse Rate 05/03/16 1358 102     Resp 05/03/16 1358 20     Temp 05/03/16 1358 98.3 F (36.8 C)     Temp Source 05/03/16 1358 Oral     SpO2 05/03/16 1358 98 %     Weight --      Height --      Head Circumference --      Peak Flow --      Pain Score 05/03/16 1400 3     Pain Loc --      Pain Edu? --      Excl. in [place]? --    No data found.   Updated Vital Signs BP 147/91 (BP Location: Left Arm)   Pulse 102   Temp 98.3 F (36.8 C) (Oral)   Resp 20   SpO2 98%   Visual Acuity Right Eye Distance:   Left Eye Distance:   Bilateral Distance:    Right Eye Near:   Left Eye Near:    Bilateral Near:     Physical Exam  Constitutional: He is oriented to person, place, and time. He appears well-developed and well-nourished. No distress.  HENT:  Nose: Nose normal.  Mouth/Throat: Oropharynx is clear and moist. No oropharyngeal exudate.  Right TM is normal. Left TM with erythema on the superior aspect. No appreciable effusion.  Neck: Neck supple.  Cardiovascular: Normal rate, regular rhythm and normal heart sounds.   No murmur heard. Pulmonary/Chest: Effort normal and breath sounds normal. No respiratory distress. He has no wheezes. He has no rales.  Wheeze heard with cough  Lymphadenopathy:    He has no cervical adenopathy.  Neurological: He is alert and oriented to person, place, and time.     UC Treatments / Results  Labs (all labs ordered are listed, but only abnormal results are displayed) Labs Reviewed - No data to display  EKG  EKG Interpretation None       Radiology No results found.  Procedures Procedures (including critical care time)  Medications Ordered in UC Medications - No data to display   Initial Impression / Assessment and Plan  / UC Course  I have reviewed the triage vital signs and the nursing notes.  Pertinent labs & imaging results that were available during my care of the patient were reviewed  by me and considered in my medical decision making (see chart for details).     We'll treat for bronchitis with azithromycin, prednisone, albuterol, and Tessalon. Return precautions reviewed. Work note provided.  Final Clinical Impressions(s) / UC Diagnoses   Final diagnoses:  Bronchitis    New Prescriptions Discharge Medication List as of 05/03/2016  2:32 PM    START taking these medications   Details  albuterol (PROVENTIL HFA;VENTOLIN HFA) 108 (90 Base) MCG/ACT inhaler Inhale 2 puffs into the lungs every 4 (four) hours as needed for wheezing or shortness of breath., Starting Thu 05/03/2016, Normal    azithromycin (ZITHROMAX Z-PAK) 250 MG tablet Take 2 pills today, then 1 pill daily until gone., Normal    benzonatate (TESSALON) 100 MG capsule Take 1 capsule (100 mg total) by mouth every 8 (eight) hours., Starting Thu 05/03/2016, Normal    predniSONE (DELTASONE) 50 MG tablet Take 1 pill daily for 5 days., Normal         Melony Overly, MD 05/03/16 [phone]

## 2016-05-03 NOTE — Discharge Instructions (Signed)
You have bronchitis. Take azithromycin and prednisone as prescribed. Use tessalon as needed for cough. Use the albuterol every 4 hours as needed for wheezing or cough. You should see improvement in the next 2-3 days. If you develop fevers, difficulty breathing, or are just not getting better, please come back or go to the emergency room.

## 2016-05-04 ENCOUNTER — Telehealth: Payer: Self-pay

## 2016-05-04 NOTE — Telephone Encounter (Signed)
PLEASE NOTE: All timestamps contained within this report are represented as [place] Standard Time. CONFIDENTIALTY NOTICE: This fax transmission is intended only for the addressee. It contains information that is legally privileged, confidential or otherwise protected from use or disclosure. If you are not the intended recipient, you are strictly prohibited from reviewing, disclosing, copying using or disseminating any of this information or taking any action in reliance on or regarding this information. If you have received this fax in error, please notify us immediately by telephone so that we can arrange for its return to [place]. Phone: [phone], Toll-Free: [phone], Fax: [phone] Page: 1 of 2 Call Id: [id number] [place] Patient Name: Erik [name] Gender: Male DOB: [birth year] Age: 19 Y 54 M 22 D Return Phone Number: [phone] (Primary), [phone] (Secondary) Address: City/State/Zip: [place] [address] Client [facility] - Client Client Site [place] Physician Glori Bickers, Roque Lias - MD Contact Type Call Who Is Calling Patient / Member / Family / Caregiver Call Type Triage / Clinical Caller Name Tyrel Vandruff Relationship To Patient Spouse Return Phone Number [phone] (Primary) Chief Complaint Cough Reason for Call Symptomatic / Request for [place] says, husband sick since Friday, aches and pains, sore throat, ears hurt, feels weak, productive cough. He feels worse today then yesterday. -- will check his temp for nurse -- Appointment Disposition EMR Appointment Not Necessary Info pasted into Epic No PreDisposition Call Doctor Translation No Nurse Assessment Nurse: Leilani Merl, RN, Heather Date/Time (Eastern Time): 05/02/2016 2:16:46 PM Confirm and document reason for call. If symptomatic, describe  symptoms. ---Caller says, husband sick since Friday, aches and pains, sore throat, ears are congested with pressure, feels weak, productive cough. He feels worse today then yesterday. Temp 99.5 Does the patient have any new or worsening symptoms? ---Yes Will a triage be completed? ---Yes Related visit to physician within the last 2 weeks? ---No Does the PT have any chronic conditions? (i.e. diabetes, asthma, etc.) ---Yes List chronic conditions. ---HTN Is this a behavioral health or substance abuse call? ---No Guidelines Guideline Title Affirmed Question Affirmed Notes Nurse Date/Time (Eastern Time) Cough - Acute Productive SEVERE coughing spells (e.g., whooping sound after coughing, vomiting after coughing) Standifer, RN, Heather 05/02/2016 2:17:41 PM PLEASE NOTE: All timestamps contained within this report are represented as [place] Standard Time. CONFIDENTIALTY NOTICE: This fax transmission is intended only for the addressee. It contains information that is legally privileged, confidential or otherwise protected from use or disclosure. If you are not the intended recipient, you are strictly prohibited from reviewing, disclosing, copying using or disseminating any of this information or taking any action in reliance on or regarding this information. If you have received this fax in error, please notify us immediately by telephone so that we can arrange for its return to [place]. Phone: [phone], Toll-Free: [phone], Fax: [phone] Page: 2 of 2 Call Id: [id number] [place]. Time Eilene Ghazi Time) Disposition Final User 05/02/2016 2:27:30 PM Call Completed Standifer, RN, Nira Conn 05/02/2016 2:26:30 PM See Physician within 24 Hours Yes Standifer, RN, Banker Understands: Yes Disagree/Comply: Comply Care Advice Given Per Guideline SEE PHYSICIAN WITHIN 24 HOURS: * IF OFFICE WILL BE OPEN: You need to be seen within the next 24 hours. Call your doctor when the office opens, and make an  appointment. COUGH MEDICINES: - OTC COUGH SYRUPS: The most common cough suppressant in OTC cough medications is dextromethorphan. Often  the letters 'DM' appear in the name. - OTC COUGH DROPS: Cough drops can help a lot, especially for mild coughs. They reduce coughing by soothing your irritated throat and removing that tickle sensation in the back of the throat. Cough drops also have the advantage of portability - you can carry them with you. HUMIDIFIER: If the air is dry, use a humidifier in the bedroom. (Reason: dry air makes coughs worse) CALL BACK IF: * You become worse. * Difficulty breathing occurs Referrals GO TO FACILITY UNDECIDED

## 2016-05-04 NOTE — Telephone Encounter (Signed)
Per chart review tab pt seen UC 05/04/15.

## 2016-05-08 ENCOUNTER — Other Ambulatory Visit: Payer: Self-pay | Admitting: Internal Medicine

## 2016-05-12 ENCOUNTER — Ambulatory Visit (INDEPENDENT_AMBULATORY_CARE_PROVIDER_SITE_OTHER): Payer: BLUE CROSS/BLUE SHIELD

## 2016-05-12 ENCOUNTER — Encounter (HOSPITAL_COMMUNITY): Payer: Self-pay | Admitting: Family Medicine

## 2016-05-12 ENCOUNTER — Ambulatory Visit (HOSPITAL_COMMUNITY)
Admission: EM | Admit: 2016-05-12 | Discharge: 2016-05-12 | Disposition: A | Discharge status: Home / Self Care | Payer: BLUE CROSS/BLUE SHIELD | Attending: Family Medicine | Admitting: Family Medicine

## 2016-05-12 DIAGNOSIS — R05 Cough: Secondary | ICD-10-CM | POA: Diagnosis not present

## 2016-05-12 DIAGNOSIS — J111 Influenza due to unidentified influenza virus with other respiratory manifestations: Secondary | ICD-10-CM

## 2016-05-12 DIAGNOSIS — R69 Illness, unspecified: Secondary | ICD-10-CM | POA: Diagnosis not present

## 2016-05-12 MED ORDER — IPRATROPIUM BROMIDE 0.06 % NA SOLN: 2.0000 | Freq: Four times a day (QID) | NASAL | 1 refills | Status: DC

## 2016-05-12 MED ORDER — GUAIFENESIN-CODEINE 100-10 MG/5ML PO SYRP: 10.0000 mL | ORAL_SOLUTION | Freq: Four times a day (QID) | ORAL | 0 refills | Status: DC | PRN

## 2016-05-12 MED ORDER — ACETAMINOPHEN 325 MG PO TABS
650.0000 mg | ORAL_TABLET | Freq: Once | ORAL | Status: AC
  Administered 2016-05-12: 650 mg via ORAL

## 2016-05-12 MED ORDER — ACETAMINOPHEN 325 MG PO TABS
ORAL_TABLET | ORAL | Status: AC
  Filled 2016-05-12: qty 2

## 2016-05-12 NOTE — ED Triage Notes (Signed)
Pt here for cough, cold, fever, weakness and congestion.

## 2016-05-12 NOTE — ED Provider Notes (Signed)
[place]    CSN: [id number] Arrival date & time: 05/12/16  1512     History   Chief Complaint Chief Complaint  Patient presents with  . Cough    HPI TASHUN ROULHAC is a 61 y.o. male.   The history is provided by the patient and the spouse.  Cough  Cough characteristics:  Non-productive Severity:  Moderate Onset quality:  Sudden Duration:  4 days Chronicity:  Recurrent Smoker: no   Context: sick contacts, upper respiratory infection and weather changes   Associated symptoms: chills, fever, rhinorrhea, shortness of breath and sore throat     Past Medical History:  Diagnosis Date  . Allergy    seasonal  . Arthritis   . Cataract    surgery  . Chronic headaches   . Colon polyps    hyperplastic  . Diverticulosis   . Eczema   . Elevated liver enzymes   . Esophageal stricture   . GERD (gastroesophageal reflux disease)    with HH  . Hyperlipidemia   . Hypertension   . Labile blood pressure   . Polycythemia   . Thrombocytopenia ([place]) 09/20/2011    Patient Active Problem List   Diagnosis Date Noted  . Tick bite 10/24/2015  . Essential hypertension 03/21/2015  . Hyperglycemia 05/09/2012  . Elevated PSA 05/09/2012  . Routine general medical examination at a health care facility 05/01/2012  . Prostate cancer screening 05/01/2012  . Thrombocytopenia ([place]) 09/20/2011  . Gastritis 09/08/2011  . Polycythemia 08/29/2011  . Obesity 08/01/2011  . Dyspnea on exertion 06/01/2011  . HEMOCCULT POSITIVE STOOL 06/23/2010  . HEADACHE 06/12/2010  . Hyperlipidemia 02/28/2009    Past Surgical History:  Procedure Laterality Date  . Abdominal US  2002   Negative  . BACK SURGERY     Ruptured disk with staph infection  . CATARACT EXTRACTION W/ INTRAOCULAR LENS  IMPLANT, BILATERAL    . CERVICAL DISC SURGERY     with Staph infection  . CHOLECYSTECTOMY  2007  . ESOPHAGOGASTRODUODENOSCOPY  2001   Stricture/GERD and HH  . KNEE ARTHROSCOPY     right  . UPPER  GASTROINTESTINAL ENDOSCOPY         Home Medications    Prior to Admission medications   Medication Sig Start Date End Date Taking? Authorizing Provider  albuterol (PROVENTIL HFA;VENTOLIN HFA) 108 (90 Base) MCG/ACT inhaler Inhale 2 puffs into the lungs every 4 (four) hours as needed for wheezing or shortness of breath. 05/03/16   Melony Overly, MD  aspirin 81 MG tablet Take 81 mg by mouth daily.     Historical Provider, MD  azithromycin (ZITHROMAX Z-PAK) 250 MG tablet Take 2 pills today, then 1 pill daily until gone. 05/03/16   Melony Overly, MD  benzonatate (TESSALON) 100 MG capsule Take 1 capsule (100 mg total) by mouth every 8 (eight) hours. 05/03/16   Melony Overly, MD  EPINEPHrine 0.3 mg/0.3 mL IJ SOAJ injection Inject 0.3 mLs (0.3 mg total) into the muscle once. 10/24/15   Tonia Ghent, MD  fluocinonide cream (LIDEX) [id number] % Apply 1 application topically as needed.    Historical Provider, MD  hydrochlorothiazide (HYDRODIURIL) 25 MG tablet take 1 tablet by mouth once daily 03/16/16   Abner Greenspan, MD  ibuprofen (ADVIL) 200 MG tablet Take 200 mg by mouth as needed.    Historical Provider, MD  lansoprazole (PREVACID) 30 MG capsule take 1 capsule by mouth once daily 05/08/16   Docia Chuck  Henrene Pastor, MD  predniSONE (DELTASONE) 50 MG tablet Take 1 pill daily for 5 days. 05/03/16   Melony Overly, MD    Family History Family History  Problem Relation Age of Onset  . Heart disease Father     Heart problems, CABG  . Heart failure Father   . Cancer Mother     breast CA  . Heart disease Maternal Grandfather     MI    Social History Social History  Substance Use Topics  . Smoking status: Former Smoker    Quit date: 04/16/1989  . Smokeless tobacco: Never Used  . Alcohol use 0.0 oz/week     Comment: Rare, almost none.     Allergies   Shellfish allergy   Review of Systems Review of Systems  Constitutional: Positive for chills and fever.  HENT: Positive for congestion, postnasal drip, rhinorrhea  and sore throat.   Respiratory: Positive for cough and shortness of breath.   Cardiovascular: Negative.   Gastrointestinal: Negative.   Genitourinary: Negative.      Physical Exam Triage Vital Signs ED Triage Vitals  Enc Vitals Group     BP 05/12/16 1630 136/100     Pulse Rate 05/12/16 1630 (!) 140     Resp 05/12/16 1630 20     Temp 05/12/16 1630 102.2 F (39 C)     Temp src --      SpO2 05/12/16 1630 96 %     Weight --      Height --      Head Circumference --      Peak Flow --      Pain Score 05/12/16 1631 3     Pain Loc --      Pain Edu? --      Excl. in [place]? --    No data found.   Updated Vital Signs BP 136/100   Pulse (!) 140   Temp 102.2 F (39 C)   Resp 20   SpO2 96%   Visual Acuity Right Eye Distance:   Left Eye Distance:   Bilateral Distance:    Right Eye Near:   Left Eye Near:    Bilateral Near:     Physical Exam  Constitutional: He is oriented to person, place, and time. He appears well-developed and well-nourished. He appears distressed.  HENT:  Right Ear: External ear normal.  Left Ear: External ear normal.  Nose: Nose normal.  Mouth/Throat: Oropharynx is clear and moist.  Eyes: Pupils are equal, round, and reactive to light.  Neck: Normal range of motion. Neck supple.  Cardiovascular: Normal rate, regular rhythm and normal heart sounds.   Pulmonary/Chest: Effort normal and breath sounds normal.  Abdominal: Soft. Bowel sounds are normal.  Musculoskeletal: Normal range of motion.  Lymphadenopathy:    He has no cervical adenopathy.  Neurological: He is alert and oriented to person, place, and time.  Skin: Skin is warm and dry.  Nursing note and vitals reviewed.    UC Treatments / Results  Labs (all labs ordered are listed, but only abnormal results are displayed) Labs Reviewed - No data to display  EKG  EKG Interpretation None       Radiology No results found. X-rays reviewed and report per  radiologist.  Procedures Procedures (including critical care time)  Medications Ordered in UC Medications  acetaminophen (TYLENOL) tablet 650 mg (650 mg Oral Given 05/12/16 1635)     Initial Impression / Assessment and Plan / UC Course  I have reviewed  the triage vital signs and the nursing notes.  Pertinent labs & imaging results that were available during my care of the patient were reviewed by me and considered in my medical decision making (see chart for details).       Final Clinical Impressions(s) / UC Diagnoses   Final diagnoses:  None    New Prescriptions New Prescriptions   No medications on file     Billy Fischer, MD 05/12/16 1730

## 2016-05-17 ENCOUNTER — Ambulatory Visit (INDEPENDENT_AMBULATORY_CARE_PROVIDER_SITE_OTHER): Payer: BLUE CROSS/BLUE SHIELD | Admitting: Family Medicine

## 2016-05-17 ENCOUNTER — Encounter: Payer: Self-pay | Admitting: Family Medicine

## 2016-05-17 VITALS — HR 92 | Temp 97.5°F | Ht 73.0 in | Wt 212.5 lb

## 2016-05-17 DIAGNOSIS — J029 Acute pharyngitis, unspecified: Secondary | ICD-10-CM

## 2016-05-17 DIAGNOSIS — J111 Influenza due to unidentified influenza virus with other respiratory manifestations: Secondary | ICD-10-CM

## 2016-05-17 LAB — POCT RAPID STREP A (OFFICE): RAPID STREP A SCREEN: NEGATIVE

## 2016-05-17 MED ORDER — MAGIC MOUTHWASH W/LIDOCAINE: ORAL | 0 refills | Status: DC

## 2016-05-17 NOTE — Progress Notes (Signed)
Pre visit review using our clinic review tool, if applicable. No additional management support is needed unless otherwise documented below in the visit note. 

## 2016-05-17 NOTE — Progress Notes (Signed)
Dr. Frederico Hamman T. Aianna Fahs, MD, [facility] [place], [address] Phone: [phone] Fax: [phone]  05/17/2016  Patient: Erik [name], MRN: [id number], DOB: 1955-10-05, 61 y.o.  Primary Physician:  Loura Pardon, MD   Chief Complaint  Patient presents with  . Fatigue    seen at Urgent Care on Saturday  . Fever  . Sores in Mouth  . Cough   Subjective:   Erik [name] is a 61 y.o. very pleasant male patient who presents with the following:  Influenza, seen on Saturday by Dr. Juventino Slovak at [facility].  102 temp, started about 3 weeks ago, flu-like symptoms about then no energy. Felt bad and terrible, bad coughing. This was before snow - went to [facility].   Got to feel better, then went to [place]. Then came back Friday. Did not feel good at all, sore throat, cough became worse. Has not had much appetite at all. 102 temp on sat  05/03/2016 - rx with zpak, then improved after this illness.  Past Medical History, Surgical History, Social History, Family History, Problem List, Medications, and Allergies have been reviewed and updated if relevant.  Patient Active Problem List   Diagnosis Date Noted  . Tick bite 10/24/2015  . Essential hypertension 03/21/2015  . Hyperglycemia 05/09/2012  . Elevated PSA 05/09/2012  . Routine general medical examination at a health care facility 05/01/2012  . Prostate cancer screening 05/01/2012  . Thrombocytopenia ([place]) 09/20/2011  . Gastritis 09/08/2011  . Polycythemia 08/29/2011  . Obesity 08/01/2011  . Dyspnea on exertion 06/01/2011  . HEMOCCULT POSITIVE STOOL 06/23/2010  . HEADACHE 06/12/2010  . Hyperlipidemia 02/28/2009    Past Medical History:  Diagnosis Date  . Allergy    seasonal  . Arthritis   . Cataract    surgery  . Chronic headaches   . Colon polyps    hyperplastic  . Diverticulosis   . Eczema   . Elevated liver enzymes   . Esophageal stricture   . GERD (gastroesophageal  reflux disease)    with HH  . Hyperlipidemia   . Hypertension   . Labile blood pressure   . Polycythemia   . Thrombocytopenia ([place]) 09/20/2011    Past Surgical History:  Procedure Laterality Date  . Abdominal US  2002   Negative  . BACK SURGERY     Ruptured disk with staph infection  . CATARACT EXTRACTION W/ INTRAOCULAR LENS  IMPLANT, BILATERAL    . CERVICAL DISC SURGERY     with Staph infection  . CHOLECYSTECTOMY  2007  . ESOPHAGOGASTRODUODENOSCOPY  2001   Stricture/GERD and HH  . KNEE ARTHROSCOPY     right  . UPPER GASTROINTESTINAL ENDOSCOPY      Social History   Social History  . Marital status: Married    Spouse name: N/A  . Number of children: 1  . Years of education: N/A   Occupational History  .  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    Social History Main Topics  . Smoking status: Former Smoker    Quit date: 04/16/1989  . Smokeless tobacco: Never Used  . Alcohol use 0.0 oz/week     Comment: Rare, almost none.  . Drug use: No  . Sexual activity: Yes   Other Topics Concern  . Not on file   Social History Narrative   Some exercise.  Hobby:  Hand-making bullets.  Family History  Problem Relation Age of Onset  . Heart disease Father     Heart problems, CABG  . Heart failure Father   . Cancer Mother     breast CA  . Heart disease Maternal Grandfather     MI    Allergies  Allergen Reactions  . Shellfish Allergy Anaphylaxis    Medication list reviewed and updated in full in [place].  ROS: GEN: Acute illness details above GI: Tolerating PO intake GU: maintaining adequate hydration and urination Pulm: No SOB Interactive and getting along well at home.  Otherwise, ROS is as per the HPI.  Objective:   Pulse 92   Temp 97.5 F (36.4 C) (Oral)   Ht 6\' 1"  (1.854 m)   Wt 212 lb 8 oz (96.4 kg)   SpO2 97%   BMI 28.04 kg/m    Gen: WDWN, NAD; A & O x3, cooperative.  Pleasant.Globally Non-toxic HEENT: Normocephalic and atraumatic. Throat clear, w/o exudate, R TM clear, L TM - good landmarks, No fluid present. rhinnorhea. No frontal or maxillary sinus T. MMM NECK: Anterior cervical  LAD is present CV: RRR, No M/G/R, cap refill <2 sec PULM: Breathing comfortably in no respiratory distress. no wheezing, crackles, rhonchi ABD: S,NT,ND,+BS. No HSM. No rebound. EXT: No c/c/e PSYCH: Friendly, good eye contact MSK: Nml gait    Laboratory and Imaging Data: Results for orders placed or performed in visit on 05/17/16  POCT rapid strep A  Result Value Ref Range   Rapid Strep A Screen Negative Negative     Assessment and Plan:   Influenza with respiratory symptoms  Sore throat - Plan: POCT rapid strep A  >25 minutes spent in face to face time with patient, >50% spent in counselling or coordination of care   An extended amount of time was used to discuss with the patient influenza diagnosis and symptoms, length of time that should be associated with this and overall prognosis. The patient had multiple complaints and required reassurance. I completely agree with Dr. Juventino Slovak from [facility].  He primarily was complaining about his throat, wanted a strep test, which was negative, so I gave him some Magic mouthwash.  Follow-up: No Follow-up on file.  Meds ordered this encounter  Medications  . magic mouthwash w/lidocaine SOLN    Sig: Mix: 80 ml maalox, 80 ml benadryl susp, 80 mL lidocaine 1%   1 tsp po gargle q 4 hours prn sore throat    Dispense:  240 mL    Refill:  0   Medications Discontinued During This Encounter  Medication Reason  . ipratropium (ATROVENT) 0.06 % nasal spray Completed Course  . guaiFENesin-codeine (ROBITUSSIN AC) 100-10 MG/5ML syrup Completed Course  . benzonatate (TESSALON) 100 MG capsule Completed Course  . predniSONE (DELTASONE) 50 MG tablet Completed Course  . azithromycin (ZITHROMAX Z-PAK) 250 MG tablet Completed Course   Orders  Placed This Encounter  Procedures  . POCT rapid strep A    Signed,  Kraven Calk T. Shaheem Pichon, MD   Allergies as of 05/17/2016      Reactions   Shellfish Allergy Anaphylaxis      Medication List       Accurate as of 05/17/16  1:45 PM. Always use your most recent med list.          ADVIL 200 MG tablet Generic drug:  ibuprofen Take 200 mg by mouth as needed.   albuterol 108 (90 Base) MCG/ACT inhaler Commonly known as:  PROVENTIL HFA;VENTOLIN  HFA Inhale 2 puffs into the lungs every 4 (four) hours as needed for wheezing or shortness of breath.   aspirin 81 MG tablet Take 81 mg by mouth daily.   EPINEPHrine 0.3 mg/0.3 mL Soaj injection Commonly known as:  EPI-PEN Inject 0.3 mLs (0.3 mg total) into the muscle once.   fluocinonide cream 0.05 % Commonly known as:  LIDEX Apply 1 application topically as needed.   hydrochlorothiazide 25 MG tablet Commonly known as:  HYDRODIURIL take 1 tablet by mouth once daily   lansoprazole 30 MG capsule Commonly known as:  PREVACID take 1 capsule by mouth once daily   magic mouthwash w/lidocaine Soln Mix: 80 ml maalox, 80 ml benadryl susp, 80 mL lidocaine 1%   1 tsp po gargle q 4 hours prn sore throat

## 2016-07-13 ENCOUNTER — Other Ambulatory Visit: Payer: Self-pay | Admitting: Family Medicine

## 2016-08-03 ENCOUNTER — Ambulatory Visit (INDEPENDENT_AMBULATORY_CARE_PROVIDER_SITE_OTHER): Payer: BLUE CROSS/BLUE SHIELD | Admitting: Family Medicine

## 2016-08-03 ENCOUNTER — Encounter: Payer: Self-pay | Admitting: Family Medicine

## 2016-08-03 VITALS — BP 126/88 | HR 62 | Temp 98.1°F | Ht 71.75 in | Wt 220.5 lb

## 2016-08-03 DIAGNOSIS — R972 Elevated prostate specific antigen [PSA]: Secondary | ICD-10-CM | POA: Diagnosis not present

## 2016-08-03 DIAGNOSIS — Z0000 Encounter for general adult medical examination without abnormal findings: Secondary | ICD-10-CM

## 2016-08-03 DIAGNOSIS — D696 Thrombocytopenia, unspecified: Secondary | ICD-10-CM | POA: Diagnosis not present

## 2016-08-03 DIAGNOSIS — R739 Hyperglycemia, unspecified: Secondary | ICD-10-CM | POA: Diagnosis not present

## 2016-08-03 DIAGNOSIS — E7800 Pure hypercholesterolemia, unspecified: Secondary | ICD-10-CM

## 2016-08-03 DIAGNOSIS — E6609 Other obesity due to excess calories: Secondary | ICD-10-CM

## 2016-08-03 DIAGNOSIS — I10 Essential (primary) hypertension: Secondary | ICD-10-CM | POA: Diagnosis not present

## 2016-08-03 DIAGNOSIS — Z125 Encounter for screening for malignant neoplasm of prostate: Secondary | ICD-10-CM

## 2016-08-03 DIAGNOSIS — D751 Secondary polycythemia: Secondary | ICD-10-CM

## 2016-08-03 DIAGNOSIS — Z6830 Body mass index (BMI) 30.0-30.9, adult: Secondary | ICD-10-CM

## 2016-08-03 LAB — LIPID PANEL
CHOL/HDL RATIO: 5
Cholesterol: 149 mg/dL (ref 0–200)
HDL: 31.5 mg/dL — ABNORMAL LOW (ref >39.00)
NONHDL: 117.19
Triglycerides: 336 mg/dL — ABNORMAL HIGH (ref 0.0–149.0)
VLDL: 67.2 mg/dL — ABNORMAL HIGH (ref 0.0–40.0)

## 2016-08-03 LAB — CBC WITH DIFFERENTIAL/PLATELET
BASOS ABS: 0.1 10*3/uL (ref 0.0–0.1)
Basophils Relative: 1.3 % (ref 0.0–3.0)
EOS ABS: 0.1 10*3/uL (ref 0.0–0.7)
Eosinophils Relative: 2.1 % (ref 0.0–5.0)
HEMATOCRIT: 48.9 % (ref 39.0–52.0)
Hemoglobin: 17 g/dL (ref 13.0–17.0)
LYMPHS PCT: 23.3 % (ref 12.0–46.0)
Lymphs Abs: 1.4 10*3/uL (ref 0.7–4.0)
MCHC: 34.8 g/dL (ref 30.0–36.0)
MCV: 84.4 fl (ref 78.0–100.0)
Monocytes Absolute: 0.7 10*3/uL (ref 0.1–1.0)
Monocytes Relative: 11.2 % (ref 3.0–12.0)
NEUTROS PCT: 62.1 % (ref 43.0–77.0)
Neutro Abs: 3.7 10*3/uL (ref 1.4–7.7)
PLATELETS: 130 10*3/uL — AB (ref 150.0–400.0)
RBC: 5.8 Mil/uL (ref 4.22–5.81)
RDW: 13.4 % (ref 11.5–15.5)
WBC: 6 10*3/uL (ref 4.0–10.5)

## 2016-08-03 LAB — COMPREHENSIVE METABOLIC PANEL
ALT: 31 U/L (ref 0–53)
AST: 25 U/L (ref 0–37)
Albumin: 3.9 g/dL (ref 3.5–5.2)
Alkaline Phosphatase: 192 U/L — ABNORMAL HIGH (ref 39–117)
BILIRUBIN TOTAL: 0.6 mg/dL (ref 0.2–1.2)
BUN: 21 mg/dL (ref 6–23)
CHLORIDE: 99 meq/L (ref 96–112)
CO2: 31 meq/L (ref 19–32)
CREATININE: 1.08 mg/dL (ref 0.40–1.50)
Calcium: 9.6 mg/dL (ref 8.4–10.5)
GFR: 73.64 mL/min (ref >60.00)
GLUCOSE: 376 mg/dL — AB (ref 70–99)
Potassium: 4.2 mEq/L (ref 3.5–5.1)
SODIUM: 136 meq/L (ref 135–145)
Total Protein: 6.5 g/dL (ref 6.0–8.3)

## 2016-08-03 LAB — HEMOGLOBIN A1C: Hgb A1c MFr Bld: 9 % — ABNORMAL HIGH (ref 4.6–6.5)

## 2016-08-03 LAB — TSH: TSH: 1.42 u[IU]/mL (ref 0.35–4.50)

## 2016-08-03 LAB — LDL CHOLESTEROL, DIRECT: LDL DIRECT: 56 mg/dL

## 2016-08-03 MED ORDER — HYDROCHLOROTHIAZIDE 25 MG PO TABS: 25.0000 mg | ORAL_TABLET | Freq: Every day | ORAL | 11 refills | Status: DC

## 2016-08-03 NOTE — Patient Instructions (Addendum)
Do your best to control your weight  We would like you to get 30 more minutes a day of exercise (at least 5 days per week)  If you are interested in a shingles/zoster vaccine - call your insurance to check on coverage,( you should not get it within 1 month of other vaccines) , then call us for a prescription  for it to take to a pharmacy that gives the shot , or make a nurse visit to get it here depending on your coverage   The new vaccine is called Shingrix    Wellness labs today   Stop at check out for referral to urology

## 2016-08-03 NOTE — Progress Notes (Signed)
Pre visit review using our clinic review tool, if applicable. No additional management support is needed unless otherwise documented below in the visit note. 

## 2016-08-03 NOTE — Progress Notes (Signed)
Subjective:    Patient ID: Erik [name], male    DOB: 30-May-1955, 61 y.o.   MRN: [id number]  HPI Here for health maintenance exam and to review chronic medical problems    Feeling ok overall  Playing paint ball again this year   Wt Readings from Last 3 Encounters:  08/03/16 220 lb 8 oz (100 kg)  05/17/16 212 lb 8 oz (96.4 kg)  10/24/15 228 lb (103.4 kg)  not eating a healthy diet - not motivated to change / he "rebels against it"  His wt is up but he was sick when it was lower  Big appetite  Too much snacking  "too tired" and works too much to exercise   (also some knee problems from an old work injury) bmi 30.1  Declines hep C/HIV screen   Flu shot -he did not get   Shingles vaccine - is interested in the vaccine if it is covered   Colonoscopy 4/12 done - had a hyperplastic polyp - 10 year recall   Tetanus 12/16   bp is stable today  No cp or palpitations or headaches or edema  No side effects to medicines  BP Readings from Last 3 Encounters:  08/03/16 126/88  05/12/16 136/100  05/03/16 147/91     Hx of thrombocytopenia and polycythemia (donates blood for this) He has seen Dr Alvy Bimler in hematology Lab Results  Component Value Date   WBC 6.3 03/17/2015   HGB 17.2 (H) 03/17/2015   HCT 50.6 03/17/2015   MCV 85.4 03/17/2015   PLT 168.0 03/17/2015   Due for labs    Prostate health  Hx of elevated psa and ref to urology/ with hx of prostate [place] and bx (normal) in 2014 Lab Results  Component Value Date   PSA 4.62 (H) 03/17/2015   PSA 4.56 (H) 05/02/2012   PSA 1.36 11/28/2009   we will refer him for annual f/u with Dr Risa Grill No complaints at all regarding urination or nocturia    Hx of hyperlipidemia Lab Results  Component Value Date   CHOL 137 03/17/2015   HDL 32.50 (L) 03/17/2015   LDLCALC 77 03/17/2015   LDLDIRECT 75.6 05/02/2012   TRIG 139.0 03/17/2015   CHOLHDL 4 03/17/2015   Hx of hyperglycemia Lab Results  Component Value Date   HGBA1C  5.7 07/31/2012   Due for labs today   Patient Active Problem List   Diagnosis Date Noted  . Essential hypertension 03/21/2015  . Hyperglycemia 05/09/2012  . Elevated PSA 05/09/2012  . Routine general medical examination at a health care facility 05/01/2012  . Prostate cancer screening 05/01/2012  . Thrombocytopenia ([place]) 09/20/2011  . Gastritis 09/08/2011  . Polycythemia 08/29/2011  . Obesity 08/01/2011  . Dyspnea on exertion 06/01/2011  . HEMOCCULT POSITIVE STOOL 06/23/2010  . HEADACHE 06/12/2010  . Hyperlipidemia 02/28/2009   Past Medical History:  Diagnosis Date  . Allergy    seasonal  . Arthritis   . Cataract    surgery  . Chronic headaches   . Colon polyps    hyperplastic  . Diverticulosis   . Eczema   . Elevated liver enzymes   . Esophageal stricture   . GERD (gastroesophageal reflux disease)    with HH  . Hyperlipidemia   . Hypertension   . Labile blood pressure   . Polycythemia   . Thrombocytopenia ([place]) 09/20/2011   Past Surgical History:  Procedure Laterality Date  . Abdominal US  2002   Negative  .  BACK SURGERY     Ruptured disk with staph infection  . CATARACT EXTRACTION W/ INTRAOCULAR LENS  IMPLANT, BILATERAL    . CERVICAL DISC SURGERY     with Staph infection  . CHOLECYSTECTOMY  2007  . ESOPHAGOGASTRODUODENOSCOPY  2001   Stricture/GERD and HH  . KNEE ARTHROSCOPY     right  . UPPER GASTROINTESTINAL ENDOSCOPY     Social History  Substance Use Topics  . Smoking status: Former Smoker    Quit date: 04/16/1989  . Smokeless tobacco: Never Used  . Alcohol use 0.0 oz/week     Comment: Rare, almost none.   Family History  Problem Relation Age of Onset  . Heart disease Father     Heart problems, CABG  . Heart failure Father   . Cancer Mother     breast CA  . Heart disease Maternal Grandfather     MI   Allergies  Allergen Reactions  . Shellfish Allergy Anaphylaxis   Current Outpatient Prescriptions on File Prior to Visit  Medication Sig  Dispense Refill  . aspirin 81 MG tablet Take 81 mg by mouth daily.     Marland Kitchen EPINEPHrine 0.3 mg/0.3 mL IJ SOAJ injection Inject 0.3 mLs (0.3 mg total) into the muscle once. 2 Device 1  . fluocinonide cream (LIDEX) 8.84 % Apply 1 application topically as needed.    Marland Kitchen ibuprofen (ADVIL) 200 MG tablet Take 200 mg by mouth as needed.    . lansoprazole (PREVACID) 30 MG capsule take 1 capsule by mouth once daily 90 capsule 2  . albuterol (PROVENTIL HFA;VENTOLIN HFA) 108 (90 Base) MCG/ACT inhaler Inhale 2 puffs into the lungs every 4 (four) hours as needed for wheezing or shortness of breath. (Patient not taking: Reported on 08/03/2016) 1 Inhaler 0   No current facility-administered medications on file prior to visit.      Review of Systems Review of Systems  Constitutional: Negative for fever, appetite change, fatigue and unexpected weight change.  Eyes: Negative for pain and visual disturbance.  Respiratory: Negative for cough and shortness of breath.   Cardiovascular: Negative for cp or palpitations    Gastrointestinal: Negative for nausea, diarrhea and constipation.  Genitourinary: Negative for urgency and frequency.  Skin: Negative for pallor or rash   Neurological: Negative for weakness, light-headedness, numbness and headaches.  Hematological: Negative for adenopathy. Does not bruise/bleed easily.  Psychiatric/Behavioral: Negative for dysphoric mood. The patient is not nervous/anxious.         Objective:   Physical Exam  Constitutional: He appears well-developed and well-nourished. No distress.  obese and well appearing   HENT:  Head: Normocephalic and atraumatic.  Right Ear: External ear normal.  Left Ear: External ear normal.  Nose: Nose normal.  Mouth/Throat: Oropharynx is clear and moist.  Eyes: Conjunctivae and EOM are normal. Pupils are equal, round, and reactive to light. Right eye exhibits no discharge. Left eye exhibits no discharge. No scleral icterus.  Neck: Normal range of  motion. Neck supple. No JVD present. Carotid bruit is not present. No thyromegaly present.  Cardiovascular: Normal rate, regular rhythm, normal heart sounds and intact distal pulses.  Exam reveals no gallop.   Pulmonary/Chest: Effort normal and breath sounds normal. No respiratory distress. He has no wheezes. He exhibits no tenderness.  Abdominal: Soft. Bowel sounds are normal. He exhibits no distension, no abdominal bruit and no mass. There is no tenderness.  Musculoskeletal: He exhibits no edema or tenderness.  Lymphadenopathy:    He has no cervical  adenopathy.  Neurological: He is alert. He has normal reflexes. No cranial nerve deficit. He exhibits normal muscle tone. Coordination normal.  Skin: Skin is warm and dry. No rash noted. No erythema. No pallor.  Lentigines diffusely  Psychiatric: He has a normal mood and affect.          Assessment & Plan:   Problem List Items Addressed This Visit      Cardiovascular and Mediastinum   Essential hypertension - Primary    bp in fair control at this time  BP Readings from Last 1 Encounters:  08/03/16 126/88   No changes needed Disc lifstyle change with low sodium diet and exercise  Labs ordered Wt loss enc      Relevant Medications   hydrochlorothiazide (HYDRODIURIL) 25 MG tablet     Other   Elevated PSA    Ref to urology for yearly visit No symptoms       Relevant Orders   Ambulatory referral to Urology   Hyperglycemia    Lab Results  Component Value Date   HGBA1C 9.0 (H) 08/03/2016   A1C today  disc imp of low glycemic diet and wt loss to prevent DM2       Relevant Orders   Hemoglobin A1c (Completed)   Hyperlipidemia    Disc goals for lipids and reasons to control them Rev labs with pt from last draw Rev low sat fat diet in detail  Lab today      Relevant Medications   hydrochlorothiazide (HYDRODIURIL) 25 MG tablet   Obesity   Polycythemia    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  Has seen  hematology Donates blood when needed  Cbc today      Prostate cancer screening    Ref for his annual f/u with Dr Risa Grill No symptoms        Routine general medical examination at a health care facility    Reviewed health habits including diet and exercise and skin cancer prevention Reviewed appropriate screening tests for age  Also reviewed health mt list, fam hx and immunization status , as well as social and family history   See HPI Labs ordered  He will look into getting the shingles vaccine  Wt loss enc       Relevant Orders   CBC with Differential/Platelet (Completed)   Comprehensive metabolic panel (Completed)   Lipid panel (Completed)   TSH (Completed)   Thrombocytopenia ([place])    Last pl ct nl  Also hx of polycythemia  Has seen hematology

## 2016-08-05 NOTE — Assessment & Plan Note (Signed)
Ref for his annual f/u with Dr Risa Grill No symptoms

## 2016-08-05 NOTE — Assessment & Plan Note (Signed)
Last pl ct nl  Also hx of polycythemia  Has seen hematology

## 2016-08-05 NOTE — Assessment & Plan Note (Signed)
Disc goals for lipids and reasons to control them Rev labs with pt from last draw Rev low sat fat diet in detail  Lab today

## 2016-08-05 NOTE — Assessment & Plan Note (Signed)
Reviewed health habits including diet and exercise and skin cancer prevention Reviewed appropriate screening tests for age  Also reviewed health mt list, fam hx and immunization status , as well as social and family history   See HPI Labs ordered  He will look into getting the shingles vaccine  Wt loss enc

## 2016-08-05 NOTE — Assessment & Plan Note (Signed)
bp in fair control at this time  BP Readings from Last 1 Encounters:  08/03/16 126/88   No changes needed Disc lifstyle change with low sodium diet and exercise  Labs ordered Wt loss enc

## 2016-08-05 NOTE — Assessment & Plan Note (Signed)
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

## 2016-08-05 NOTE — Assessment & Plan Note (Signed)
Ref to urology for yearly visit No symptoms

## 2016-08-05 NOTE — Assessment & Plan Note (Signed)
Lab Results  Component Value Date   HGBA1C 9.0 (H) 08/03/2016   A1C today  disc imp of low glycemic diet and wt loss to prevent DM2

## 2016-08-06 ENCOUNTER — Telehealth: Payer: Self-pay | Admitting: Family Medicine

## 2016-08-06 NOTE — Telephone Encounter (Signed)
Patient returned Shap[place]'s call. °

## 2016-08-06 NOTE — Telephone Encounter (Signed)
Addressed through result notes  

## 2016-08-10 ENCOUNTER — Encounter: Payer: Self-pay | Admitting: Family Medicine

## 2016-08-10 ENCOUNTER — Ambulatory Visit (INDEPENDENT_AMBULATORY_CARE_PROVIDER_SITE_OTHER): Payer: BLUE CROSS/BLUE SHIELD | Admitting: Family Medicine

## 2016-08-10 VITALS — BP 118/80 | HR 78 | Temp 97.9°F | Ht 71.75 in | Wt 222.0 lb

## 2016-08-10 DIAGNOSIS — E6609 Other obesity due to excess calories: Secondary | ICD-10-CM | POA: Diagnosis not present

## 2016-08-10 DIAGNOSIS — E1165 Type 2 diabetes mellitus with hyperglycemia: Secondary | ICD-10-CM

## 2016-08-10 DIAGNOSIS — D696 Thrombocytopenia, unspecified: Secondary | ICD-10-CM | POA: Diagnosis not present

## 2016-08-10 DIAGNOSIS — Z6830 Body mass index (BMI) 30.0-30.9, adult: Secondary | ICD-10-CM

## 2016-08-10 DIAGNOSIS — I10 Essential (primary) hypertension: Secondary | ICD-10-CM

## 2016-08-10 DIAGNOSIS — IMO0001 Reserved for inherently not codable concepts without codable children: Secondary | ICD-10-CM

## 2016-08-10 DIAGNOSIS — E7800 Pure hypercholesterolemia, unspecified: Secondary | ICD-10-CM

## 2016-08-10 DIAGNOSIS — E66811 Obesity, class 1: Secondary | ICD-10-CM

## 2016-08-10 MED ORDER — METFORMIN HCL 500 MG PO TABS: 500.0000 mg | ORAL_TABLET | Freq: Two times a day (BID) | ORAL | 3 refills | Status: DC

## 2016-08-10 NOTE — Progress Notes (Signed)
Pre visit review using our clinic review tool, if applicable. No additional management support is needed unless otherwise documented below in the visit note. 

## 2016-08-10 NOTE — Progress Notes (Signed)
Subjective:    Patient ID: Erik [name], male    DOB: 1956/03/24, 61 y.o.   MRN: [id number]  HPI Here for new diagnosis of DM2  Wt Readings from Last 3 Encounters:  08/10/16 222 lb (100.7 kg)  08/03/16 220 lb 8 oz (100 kg)  05/17/16 212 lb 8 oz (96.4 kg)  he has gained and knows he needs to loose  He would like to get down to 200 or less  Lost wt when he was sick and then gained it back  bmi 30.3  Playing paint ball when he can    BP Readings from Last 3 Encounters:  08/10/16 118/80  08/03/16 126/88  05/12/16 136/100   Lab Results  Component Value Date   HGBA1C 9.0 (H) 08/03/2016   Fasting glucose 376 Wife is DM so she is educating him re: counting carbs   Loves ice cream  Not a lot of other sweets / some dark chocolates   Father had type 2 DM diag in 60s     At home 228 last check  Notes low stamina  Tingling in feet is not new  Not thirsty or urinating a lot   Does drink a lot of sweet tea and water     Cholesterol Lab Results  Component Value Date   CHOL 149 08/03/2016   HDL 31.50 (L) 08/03/2016   LDLCALC 77 03/17/2015   LDLDIRECT 56.0 08/03/2016   TRIG 336.0 (H) 08/03/2016   CHOLHDL 5 08/03/2016   He was formerly on isochrome   Lab Results  Component Value Date   WBC 6.0 08/03/2016   HGB 17.0 08/03/2016   HCT 48.9 08/03/2016   MCV 84.4 08/03/2016   PLT 130.0 (L) 08/03/2016   overall stable No bruising or bleeding    Patient Active Problem List   Diagnosis Date Noted  . Essential hypertension 03/21/2015  . Diabetes mellitus type 2, uncontrolled ([place]) 05/09/2012  . Elevated PSA 05/09/2012  . Routine general medical examination at a health care facility 05/01/2012  . Prostate cancer screening 05/01/2012  . Thrombocytopenia ([place]) 09/20/2011  . Gastritis 09/08/2011  . Polycythemia 08/29/2011  . Obesity 08/01/2011  . HEMOCCULT POSITIVE STOOL 06/23/2010  . Hyperlipidemia 02/28/2009   Past Medical History:  Diagnosis Date  .  Allergy    seasonal  . Arthritis   . Cataract    surgery  . Chronic headaches   . Colon polyps    hyperplastic  . Diverticulosis   . Eczema   . Elevated liver enzymes   . Esophageal stricture   . GERD (gastroesophageal reflux disease)    with HH  . Hyperlipidemia   . Hypertension   . Labile blood pressure   . Polycythemia   . Thrombocytopenia ([place]) 09/20/2011   Past Surgical History:  Procedure Laterality Date  . Abdominal US  2002   Negative  . BACK SURGERY     Ruptured disk with staph infection  . CATARACT EXTRACTION W/ INTRAOCULAR LENS  IMPLANT, BILATERAL    . CERVICAL DISC SURGERY     with Staph infection  . CHOLECYSTECTOMY  2007  . ESOPHAGOGASTRODUODENOSCOPY  2001   Stricture/GERD and HH  . KNEE ARTHROSCOPY     right  . UPPER GASTROINTESTINAL ENDOSCOPY     Social History  Substance Use Topics  . Smoking status: Former Smoker    Quit date: 04/16/1989  . Smokeless tobacco: Never Used  . Alcohol use 0.0 oz/week     Comment: Rare, almost  none.   Family History  Problem Relation Age of Onset  . Heart disease Father     Heart problems, CABG  . Heart failure Father   . Cancer Mother     breast CA  . Heart disease Maternal Grandfather     MI   Allergies  Allergen Reactions  . Shellfish Allergy Anaphylaxis   Current Outpatient Prescriptions on File Prior to Visit  Medication Sig Dispense Refill  . albuterol (PROVENTIL HFA;VENTOLIN HFA) 108 (90 Base) MCG/ACT inhaler Inhale 2 puffs into the lungs every 4 (four) hours as needed for wheezing or shortness of breath. 1 Inhaler 0  . aspirin 81 MG tablet Take 81 mg by mouth daily.     Marland Kitchen EPINEPHrine 0.3 mg/0.3 mL IJ SOAJ injection Inject 0.3 mLs (0.3 mg total) into the muscle once. 2 Device 1  . fluocinonide cream (LIDEX) 4.97 % Apply 1 application topically as needed.    . hydrochlorothiazide (HYDRODIURIL) 25 MG tablet Take 1 tablet (25 mg total) by mouth daily. 30 tablet 11  . ibuprofen (ADVIL) 200 MG tablet Take 200  mg by mouth as needed.    . lansoprazole (PREVACID) 30 MG capsule take 1 capsule by mouth once daily 90 capsule 2   No current facility-administered medications on file prior to visit.     Review of Systems    Review of Systems  Constitutional: Negative for fever, appetite change,  and unexpected weight change. pos for fatigue and sleeping more  Eyes: Negative for pain and visual disturbance.  Respiratory: Negative for cough and shortness of breath.   Cardiovascular: Negative for cp or palpitations    Gastrointestinal: Negative for nausea, diarrhea and constipation.  Genitourinary: Negative for urgency and frequency.  Skin: Negative for pallor or rash   Neurological: Negative for weakness, light-headedness, numbness and headaches.  Hematological: Negative for adenopathy. Does not bruise/bleed easily.  Psychiatric/Behavioral: Negative for dysphoric mood. The patient is not nervous/anxious.      Objective:   Physical Exam  Constitutional: He appears well-developed and well-nourished. No distress.  HENT:  Head: Normocephalic and atraumatic.  Eyes: Conjunctivae and EOM are normal. Pupils are equal, round, and reactive to light.  Cardiovascular: Normal rate and regular rhythm.   Neurological: He is alert. No cranial nerve deficit. He exhibits normal muscle tone. Coordination normal.  Skin: Skin is warm and dry. No rash noted. No pallor.  Psychiatric: He has a normal mood and affect.  Helpful wife is present           Assessment & Plan:   Problem List Items Addressed This Visit      Cardiovascular and Mediastinum   Essential hypertension    bp in fair control at this time  BP Readings from Last 1 Encounters:  08/10/16 118/80   No changes needed Disc lifstyle change with low sodium diet and exercise  Labs reviewed         Endocrine   Diabetes mellitus type 2, uncontrolled ([place]) - Primary    New diagnosis Lab Results  Component Value Date   HGBA1C 9.0 (H) 08/03/2016     Disc imp of wt loss with diet and exercise  Rev carb counting/dietary change (wife is diabetic which is helpful)  Ref to dm ed  Start metformin 500 bid and titrate therapy-will update if side eff like diarrhea  Disc eye and foot care  He will work to get eye exam  He will check on coverage of dm equipment and let us  know what brand he needs Check glucose bid and prn (disc goals)  f/u 3 mo with lab prior  >25 minutes spent in face to face time with patient, >50% spent in counselling or coordination of care       Relevant Medications   metFORMIN (GLUCOPHAGE) 500 MG tablet   Other Relevant Orders   Ambulatory referral to diabetic education     Other   Hyperlipidemia    Disc goals for lipids and reasons to control them Rev labs with pt Rev low sat fat diet in detail Goal LDL is 70- he is at 77 HDL low  If no imp next time will discuss statin in light of new dm diagnosis       Obesity    Discussed how this problem influences overall health and the risks it imposes  Reviewed plan for weight loss with lower calorie diet (via better food choices and also portion control or program like weight watchers) and exercise building up to or more than 30 minutes 5 days per week including some aerobic activity   Esp in light of new diag of DM2      Relevant Medications   metFORMIN (GLUCOPHAGE) 500 MG tablet   Thrombocytopenia ([facility])    Platelets 130  No symptoms  Will continue to follow

## 2016-08-10 NOTE — Patient Instructions (Addendum)
Goal for exercise - outdoors whenever possible  Add 30 minutes or more of extra exercise per day  Find out when your last eye exam was  Protect your feet and use lotion on them  We will refer you for diabetic education   Get rid of sugar in your drinks - drink water  Don't go overboard with artificial sweeteners  Try to avoid sweets as much as possible   We will call you regarding a referral for diabetes medication   Start metformin 500 mg twice daily (am and pm)   Call your insurance and find out exactly what brand of meter/ lancets/strips are covered so I can get it called in   When able -start checking glucose am fasting and then 2 hours after meal in evening or afternoon  Keep a log   Follow up in 3 months with labs prior (call to schedule)

## 2016-08-12 NOTE — Assessment & Plan Note (Signed)
Platelets 130  No symptoms  Will continue to follow

## 2016-08-12 NOTE — Assessment & Plan Note (Addendum)
New diagnosis Lab Results  Component Value Date   HGBA1C 9.0 (H) 08/03/2016   Disc imp of wt loss with diet and exercise  Rev carb counting/dietary change (wife is diabetic which is helpful)  Ref to dm ed  Start metformin 500 bid and titrate therapy-will update if side eff like diarrhea  Disc eye and foot care  He will work to get eye exam  He will check on coverage of dm equipment and let us know what brand he needs Check glucose bid and prn (disc goals)  f/u 3 mo with lab prior  >25 minutes spent in face to face time with patient, >50% spent in counselling or coordination of care

## 2016-08-12 NOTE — Assessment & Plan Note (Signed)
Disc goals for lipids and reasons to control them Rev labs with pt Rev low sat fat diet in detail Goal LDL is 70- he is at 77 HDL low  If no imp next time will discuss statin in light of new dm diagnosis

## 2016-08-12 NOTE — Assessment & Plan Note (Signed)
bp in fair control at this time  BP Readings from Last 1 Encounters:  08/10/16 118/80   No changes needed Disc lifstyle change with low sodium diet and exercise  Labs reviewed

## 2016-08-12 NOTE — Assessment & Plan Note (Signed)
Discussed how this problem influences overall health and the risks it imposes  Reviewed plan for weight loss with lower calorie diet (via better food choices and also portion control or program like weight watchers) and exercise building up to or more than 30 minutes 5 days per week including some aerobic activity   Esp in light of new diag of DM2

## 2016-08-17 ENCOUNTER — Telehealth: Payer: Self-pay | Admitting: Family Medicine

## 2016-08-17 MED ORDER — GLUCOSE BLOOD VI STRP: ORAL_STRIP | 2 refills | Status: DC

## 2016-08-17 NOTE — Telephone Encounter (Signed)
Pt would like freestyle libre meter constant glucose meter . He will also need test strips. He also has questions on carb count.  He doesn't think h is getting enough carbs. He currently is getting 30 carbs per meal and none for snacks. cb number is [phone]

## 2016-08-17 NOTE — Telephone Encounter (Signed)
Rx faxed to [facility] and pt notified of Dr. Marliss Coots instructions  Pt also requested a Rx for Freestyle lite test strips for a meter he already has at home per Dr. Glori Bickers it was okay so Rx sent

## 2016-08-17 NOTE — Telephone Encounter (Signed)
I think it is ok to go up to 45 carbs per meal if he has snacks   (if no snacks can generally go up to 60)  See how you feel with that You will learn more with diabetic ed as well Order for equip in IN box

## 2016-09-05 ENCOUNTER — Other Ambulatory Visit: Payer: Self-pay | Admitting: Family Medicine

## 2016-12-04 ENCOUNTER — Other Ambulatory Visit: Payer: Self-pay | Admitting: Family Medicine

## 2016-12-18 ENCOUNTER — Telehealth: Payer: Self-pay

## 2016-12-18 NOTE — Telephone Encounter (Signed)
Left voicemail letting pt's wife know Dr. Marliss Coots instructions and med removed from med list

## 2016-12-18 NOTE — Telephone Encounter (Signed)
Go ahead and stop the metformin entirely- he may no longer need it.  Good job!  We will see him as planned Please remove from med list   If bs go up too much we can always try a 1/2 dose

## 2016-12-18 NOTE — Telephone Encounter (Signed)
pts wife (DPR signed) left v/m; pt seen 08/10/16 and was started on metformin 500 mg bid; pt has lost 26 lbs and pts wife wants to know if could adjust metformin dosage; BS in 50's - 70s. Mrs Mussa request cb. Pt has fu appt scheduled 01/07/17.

## 2016-12-19 DIAGNOSIS — R972 Elevated prostate specific antigen [PSA]: Secondary | ICD-10-CM | POA: Diagnosis not present

## 2017-01-01 DIAGNOSIS — H43393 Other vitreous opacities, bilateral: Secondary | ICD-10-CM | POA: Diagnosis not present

## 2017-01-01 LAB — HM DIABETES EYE EXAM

## 2017-01-07 ENCOUNTER — Ambulatory Visit (INDEPENDENT_AMBULATORY_CARE_PROVIDER_SITE_OTHER): Payer: BLUE CROSS/BLUE SHIELD | Admitting: Family Medicine

## 2017-01-07 ENCOUNTER — Encounter: Payer: Self-pay | Admitting: Family Medicine

## 2017-01-07 VITALS — BP 126/78 | HR 76 | Temp 97.3°F | Ht 71.75 in | Wt 195.0 lb

## 2017-01-07 DIAGNOSIS — E7800 Pure hypercholesterolemia, unspecified: Secondary | ICD-10-CM | POA: Diagnosis not present

## 2017-01-07 DIAGNOSIS — I10 Essential (primary) hypertension: Secondary | ICD-10-CM | POA: Diagnosis not present

## 2017-01-07 DIAGNOSIS — E1165 Type 2 diabetes mellitus with hyperglycemia: Secondary | ICD-10-CM | POA: Diagnosis not present

## 2017-01-07 DIAGNOSIS — IMO0001 Reserved for inherently not codable concepts without codable children: Secondary | ICD-10-CM

## 2017-01-07 DIAGNOSIS — Z23 Encounter for immunization: Secondary | ICD-10-CM

## 2017-01-07 LAB — HEMOGLOBIN A1C: HEMOGLOBIN A1C: 6.1 % (ref 4.6–6.5)

## 2017-01-07 LAB — LIPID PANEL
CHOLESTEROL: 145 mg/dL (ref 0–200)
HDL: 37.8 mg/dL — ABNORMAL LOW (ref >39.00)
LDL CALC: 91 mg/dL (ref 0–99)
NonHDL: 107.32
TRIGLYCERIDES: 82 mg/dL (ref 0.0–149.0)
Total CHOL/HDL Ratio: 4
VLDL: 16.4 mg/dL (ref 0.0–40.0)

## 2017-01-07 LAB — MICROALBUMIN / CREATININE URINE RATIO
Creatinine,U: 160.7 mg/dL
Microalb Creat Ratio: 0.4 mg/g (ref 0.0–30.0)
Microalb, Ur: <0.7 mg/dL (ref 0.0–1.9)

## 2017-01-07 MED ORDER — FREESTYLE LIBRE SENSOR SYSTEM MISC: 3 refills | Status: DC

## 2017-01-07 MED ORDER — HYDROCHLOROTHIAZIDE 25 MG PO TABS: 25.0000 mg | ORAL_TABLET | Freq: Every day | ORAL | 3 refills | Status: DC

## 2017-01-07 NOTE — Assessment & Plan Note (Signed)
bp in fair control at this time  BP Readings from Last 1 Encounters:  01/07/17 126/78   No changes needed Disc lifstyle change with low sodium diet and exercise  Commended on wt loss and lifestyle change

## 2017-01-07 NOTE — Assessment & Plan Note (Signed)
Re check lipids with large lifestyle change  Prev high triglycerides Commended

## 2017-01-07 NOTE — Patient Instructions (Signed)
Great job with lifestyle change and weight loss and diabetes control Keep up the good work! Stay active  Labs today

## 2017-01-07 NOTE — Assessment & Plan Note (Signed)
Due for A1C and microalb  Large change in lifestyle - with 25 lb wt loss Stopped metformin due to hypoglycemia  Sent for eye exam report  Suspect much improvement in A1C Commended on lifestyle change and DM education  Flu shot today

## 2017-01-07 NOTE — Progress Notes (Signed)
Subjective:    Patient ID: Erik [name], male    DOB: Jun 08, 1955, 61 y.o.   MRN: [id number]  HPI Here for f/u of chronic health problems   Wt Readings from Last 3 Encounters:  01/07/17 195 lb (88.5 kg)  08/10/16 222 lb (100.7 kg)  08/03/16 220 lb 8 oz (100 kg)  wt loss is going well! 26.63 kg/m  DM2 Pt was ref to DM teaching and started on metfomrin last visit  Ended up loosing wt and getting some hypoglycemia inst to stop the metformin  Lab Results  Component Value Date   HGBA1C 9.0 (H) 08/03/2016  getting used to the new lifestyle  Misses the tea  Drinks some diet drinks  Lots of water  Staying away from carbs/eating a lot differently  Gave up french fries  Cut 1/2 the bread out of things also  Did DM teaching   No more hypoglycemia w/o metformin  avg glucose for 7 d is 116  For 90 days- 113   150 after breakfast   Works hard outside - lots of exercise   Just went for eye exam last week /no retinopathy     BP Readings from Last 3 Encounters:  01/07/17 126/78  08/10/16 118/80  08/03/16 126/88   laset time triglycerides were high Lab Results  Component Value Date   CHOL 149 08/03/2016   HDL 31.50 (L) 08/03/2016   LDLCALC 77 03/17/2015   LDLDIRECT 56.0 08/03/2016   TRIG 336.0 (H) 08/03/2016   CHOLHDL 5 08/03/2016   Patient Active Problem List   Diagnosis Date Noted  . Essential hypertension 03/21/2015  . Diabetes mellitus type 2, uncontrolled ([place]) 05/09/2012  . Elevated PSA 05/09/2012  . Routine general medical examination at a health care facility 05/01/2012  . Prostate cancer screening 05/01/2012  . Thrombocytopenia ([place]) 09/20/2011  . Gastritis 09/08/2011  . Polycythemia 08/29/2011  . Obesity 08/01/2011  . HEMOCCULT POSITIVE STOOL 06/23/2010  . Hyperlipidemia 02/28/2009   Past Medical History:  Diagnosis Date  . Allergy    seasonal  . Arthritis   . Cataract    surgery  . Chronic headaches   . Colon polyps    hyperplastic  .  Diverticulosis   . Eczema   . Elevated liver enzymes   . Esophageal stricture   . GERD (gastroesophageal reflux disease)    with HH  . Hyperlipidemia   . Hypertension   . Labile blood pressure   . Polycythemia   . Thrombocytopenia ([place]) 09/20/2011   Past Surgical History:  Procedure Laterality Date  . Abdominal US  2002   Negative  . BACK SURGERY     Ruptured disk with staph infection  . CATARACT EXTRACTION W/ INTRAOCULAR LENS  IMPLANT, BILATERAL    . CERVICAL DISC SURGERY     with Staph infection  . CHOLECYSTECTOMY  2007  . ESOPHAGOGASTRODUODENOSCOPY  2001   Stricture/GERD and HH  . KNEE ARTHROSCOPY     right  . UPPER GASTROINTESTINAL ENDOSCOPY     Social History  Substance Use Topics  . Smoking status: Former Smoker    Quit date: 04/16/1989  . Smokeless tobacco: Never Used  . Alcohol use 0.0 oz/week     Comment: Rare, almost none.   Family History  Problem Relation Age of Onset  . Heart disease Father        Heart problems, CABG  . Heart failure Father   . Cancer Mother        breast  CA  . Heart disease Maternal Grandfather        MI   Allergies  Allergen Reactions  . Shellfish Allergy Anaphylaxis   Current Outpatient Prescriptions on File Prior to Visit  Medication Sig Dispense Refill  . albuterol (PROVENTIL HFA;VENTOLIN HFA) 108 (90 Base) MCG/ACT inhaler Inhale 2 puffs into the lungs every 4 (four) hours as needed for wheezing or shortness of breath. 1 Inhaler 0  . aspirin 81 MG tablet Take 81 mg by mouth daily.     Marland Kitchen EPINEPHrine 0.3 mg/0.3 mL IJ SOAJ injection Inject 0.3 mLs (0.3 mg total) into the muscle once. 2 Device 1  . fluocinonide cream (LIDEX) 2.95 % Apply 1 application topically as needed.    Marland Kitchen glucose blood (FREESTYLE LITE) test strip Use to check glucose BID and PRN (dx. E11.65) 100 each 2  . ibuprofen (ADVIL) 200 MG tablet Take 200 mg by mouth as needed.    . lansoprazole (PREVACID) 30 MG capsule take 1 capsule by mouth once daily 90 capsule 2    No current facility-administered medications on file prior to visit.     Review of Systems  Constitutional: Negative for activity change, appetite change, fatigue, fever and unexpected weight change.  HENT: Negative for congestion, rhinorrhea, sore throat and trouble swallowing.   Eyes: Negative for pain, redness, itching and visual disturbance.  Respiratory: Negative for cough, chest tightness, shortness of breath and wheezing.   Cardiovascular: Negative for chest pain and palpitations.  Gastrointestinal: Negative for abdominal pain, blood in stool, constipation, diarrhea and nausea.  Endocrine: Negative for cold intolerance, heat intolerance, polydipsia and polyuria.  Genitourinary: Negative for difficulty urinating, dysuria, frequency and urgency.  Musculoskeletal: Negative for arthralgias, joint swelling and myalgias.  Skin: Negative for pallor and rash.  Neurological: Negative for dizziness, tremors, weakness, numbness and headaches.  Hematological: Negative for adenopathy. Does not bruise/bleed easily.  Psychiatric/Behavioral: Negative for decreased concentration and dysphoric mood. The patient is not nervous/anxious.        Objective:   Physical Exam  Constitutional: He appears well-developed and well-nourished. No distress.  Wt loss noted   HENT:  Head: Normocephalic and atraumatic.  Mouth/Throat: Oropharynx is clear and moist.  Eyes: Pupils are equal, round, and reactive to light. Conjunctivae and EOM are normal.  Neck: Normal range of motion. Neck supple. No JVD present. Carotid bruit is not present. No thyromegaly present.  Cardiovascular: Normal rate, regular rhythm, normal heart sounds and intact distal pulses.  Exam reveals no gallop.   Pulmonary/Chest: Effort normal and breath sounds normal. No respiratory distress. He has no wheezes. He has no rales.  No crackles  Abdominal: Soft. Bowel sounds are normal. He exhibits no distension, no abdominal bruit and no mass.  There is no tenderness.  Musculoskeletal: He exhibits no edema.  Lymphadenopathy:    He has no cervical adenopathy.  Neurological: He is alert. He has normal reflexes.  Skin: Skin is warm and dry. No rash noted.  Psychiatric: He has a normal mood and affect.          Assessment & Plan:   Problem List Items Addressed This Visit      Cardiovascular and Mediastinum   Essential hypertension    bp in fair control at this time  BP Readings from Last 1 Encounters:  01/07/17 126/78   No changes needed Disc lifstyle change with low sodium diet and exercise  Commended on wt loss and lifestyle change       Relevant Medications  hydrochlorothiazide (HYDRODIURIL) 25 MG tablet     Endocrine   Diabetes mellitus type 2, uncontrolled ([place])    Due for A1C and microalb  Large change in lifestyle - with 25 lb wt loss Stopped metformin due to hypoglycemia  Sent for eye exam report  Suspect much improvement in A1C Commended on lifestyle change and DM education  Flu shot today       Relevant Orders   Lipid panel (Completed)   Hemoglobin A1c (Completed)   Microalbumin / creatinine urine ratio (Completed)     Other   Hyperlipidemia    Re check lipids with large lifestyle change  Prev high triglycerides Commended       Relevant Medications   hydrochlorothiazide (HYDRODIURIL) 25 MG tablet   Other Relevant Orders   Lipid panel (Completed)    Other Visit Diagnoses    Need for influenza vaccination    -  Primary   Relevant Orders   Flu Vaccine QUAD 6+ mos PF IM (Fluarix Quad PF) (Completed)

## 2017-01-08 ENCOUNTER — Other Ambulatory Visit: Payer: Self-pay | Admitting: Internal Medicine

## 2017-01-08 ENCOUNTER — Encounter: Payer: Self-pay | Admitting: *Deleted

## 2017-03-04 ENCOUNTER — Other Ambulatory Visit: Payer: Self-pay | Admitting: Family Medicine

## 2017-03-04 NOTE — Telephone Encounter (Signed)
Electronic refill Medication no longer on list See last office notes 01/07/17

## 2017-03-04 NOTE — Telephone Encounter (Signed)
I thought he stopped it due to hypoglycemia

## 2017-03-05 NOTE — Telephone Encounter (Signed)
Spoke to patient and was advised that he did stop the medication and does not know why it was requested. Refill denial sent back to pharmacy.

## 2017-03-21 IMAGING — DX DG CHEST 2V
2 series · 2 of 2 positions shown · non-contrast
Comparison: None.

CLINICAL DATA: Cough, fever and congestion with body aches 3 weeks.

EXAM:
CHEST  2 VIEW

[chest pa]
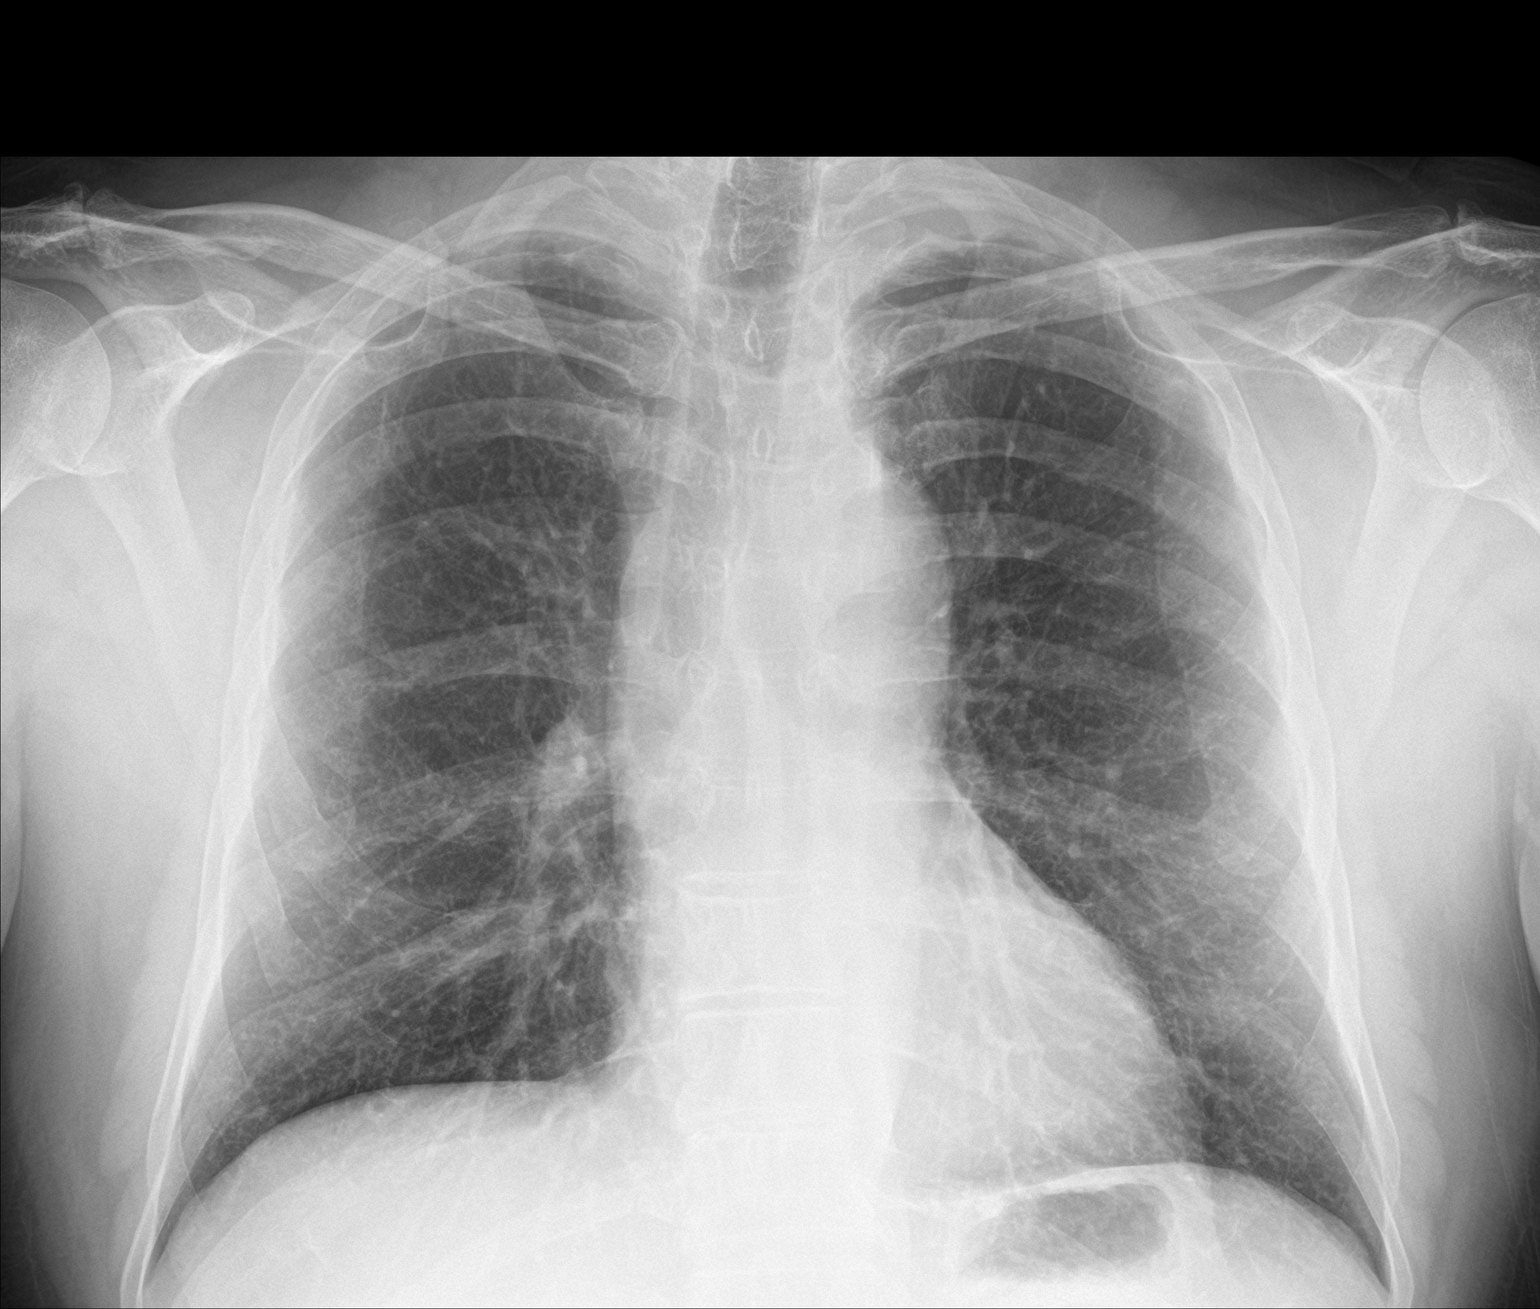

[chest lat]
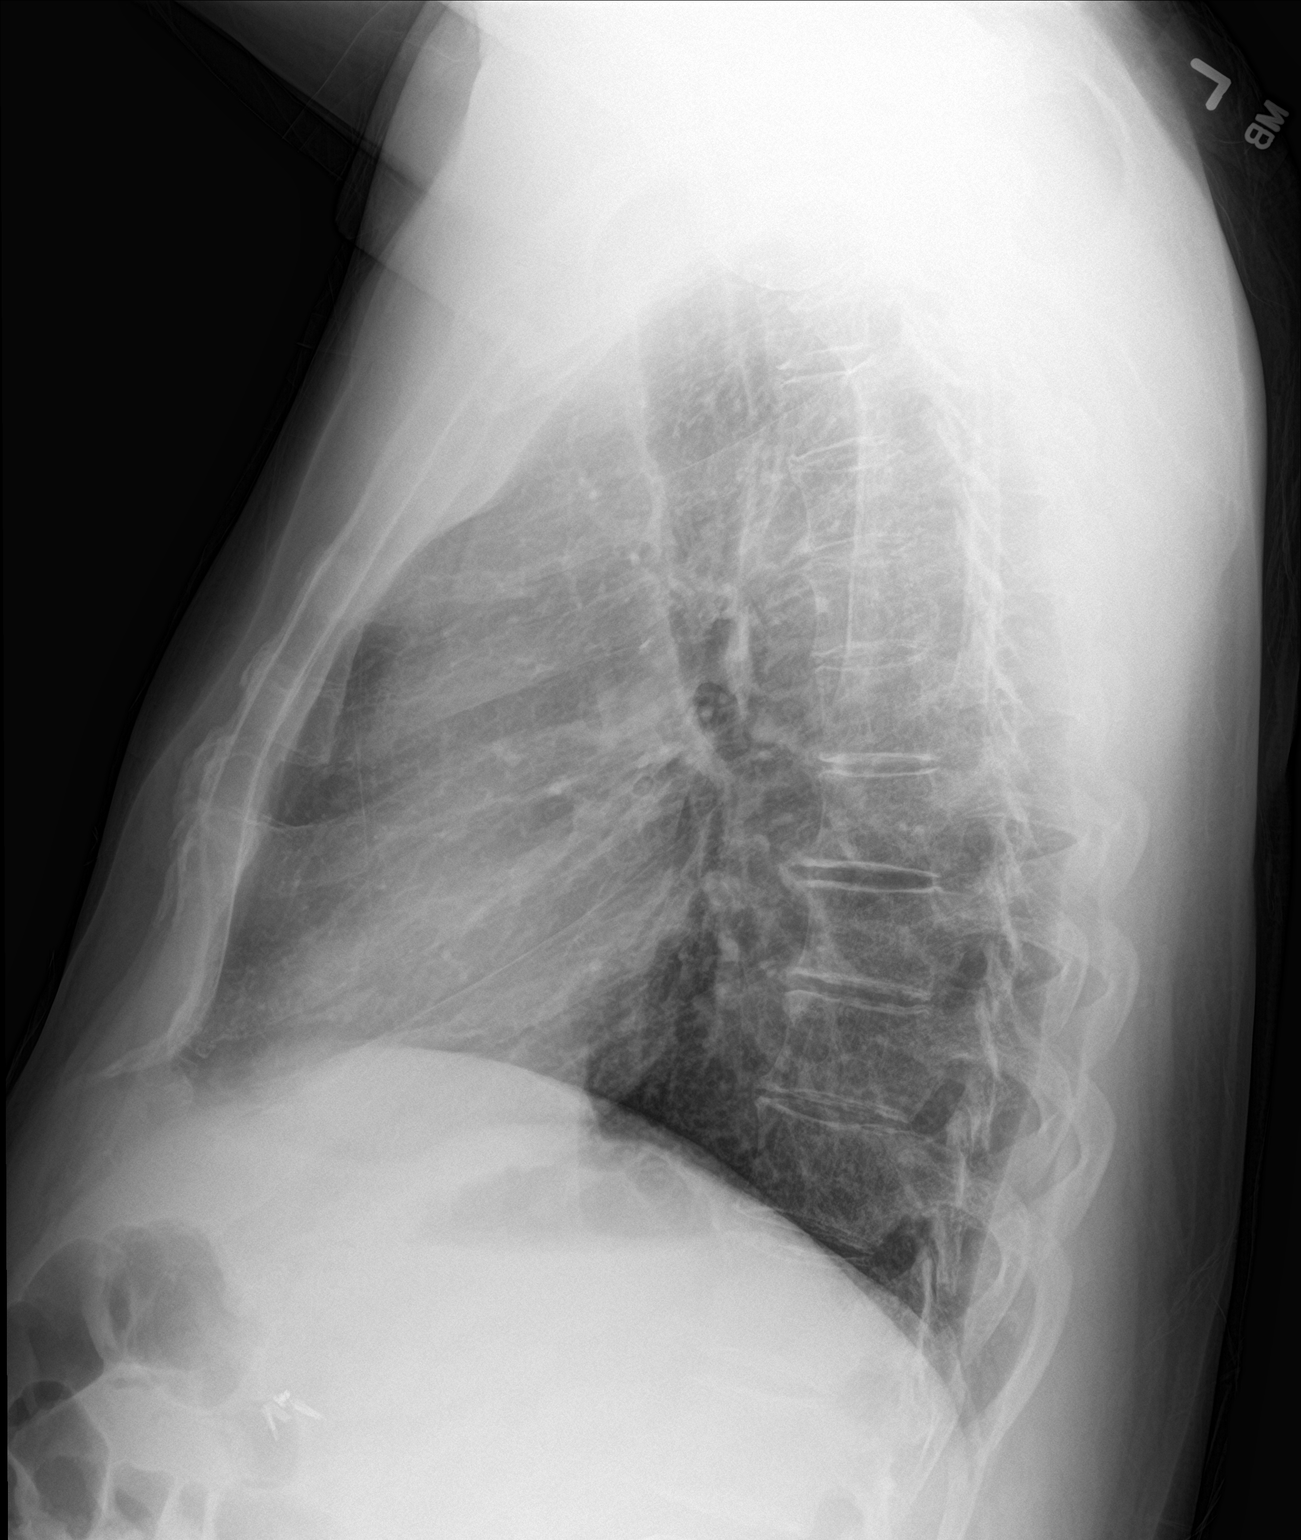

[2 of 2 positions shown; findings below may reference images not displayed]

FINDINGS: Lungs are adequately inflated without focal consolidation or
effusion. Cardiomediastinal silhouette is within normal. There are
mild degenerative changes of the spine.
IMPRESSION: No active cardiopulmonary disease.

## 2017-05-20 ENCOUNTER — Telehealth: Payer: Self-pay | Admitting: Internal Medicine

## 2017-05-21 ENCOUNTER — Telehealth: Payer: Self-pay

## 2017-05-21 MED ORDER — LANSOPRAZOLE 30 MG PO CPDR: 30.0000 mg | DELAYED_RELEASE_CAPSULE | Freq: Every day | ORAL | 1 refills | Status: DC

## 2017-05-21 NOTE — Telephone Encounter (Signed)
Refilled Lansoprazole.  Patient needs to keep upcoming office visit

## 2017-05-21 NOTE — Telephone Encounter (Signed)
Copied from [place] [id number]. Topic: General - Other >> May 21, 2017  9:44 AM Yvette Rack wrote: Reason for CRM: patient calling to see if you have a coupon for a free 14 day readers for the free style light

## 2017-05-22 ENCOUNTER — Ambulatory Visit (INDEPENDENT_AMBULATORY_CARE_PROVIDER_SITE_OTHER): Payer: BLUE CROSS/BLUE SHIELD | Admitting: Internal Medicine

## 2017-05-22 ENCOUNTER — Encounter: Payer: Self-pay | Admitting: Internal Medicine

## 2017-05-22 VITALS — BP 122/80 | HR 62 | Ht 72.0 in | Wt 201.2 lb

## 2017-05-22 DIAGNOSIS — K222 Esophageal obstruction: Secondary | ICD-10-CM

## 2017-05-22 DIAGNOSIS — K2100 Gastro-esophageal reflux disease with esophagitis, without bleeding: Secondary | ICD-10-CM

## 2017-05-22 DIAGNOSIS — R1319 Other dysphagia: Secondary | ICD-10-CM

## 2017-05-22 DIAGNOSIS — R1310 Dysphagia, unspecified: Secondary | ICD-10-CM | POA: Diagnosis not present

## 2017-05-22 NOTE — Telephone Encounter (Signed)
Copied from [place] [id number]. Topic: General - Other >> May 22, 2017  4:12 PM Lolita Rieger, [place] wrote: Reason for CRM:Pt currently uses a 10 day reader. Pt would like a coupon for a 14 day reader along with sensors. If a coupon is not available pt would need a prescription for his 10 day sensors sent to his pharmacy.

## 2017-05-22 NOTE — Patient Instructions (Addendum)
We will send your 90 day of Prevacid to your pharmacy   Follow up in 2 years   If you are age 63 or older, your body mass index should be between 23-30. Your Body mass index is 27.29 kg/m. If this is out of the aforementioned range listed, please consider follow up with your Primary Care Provider.  If you are age 81 or younger, your body mass index should be between 19-25. Your Body mass index is 27.29 kg/m. If this is out of the aformentioned range listed, please consider follow up with your Primary Care Provider.

## 2017-05-22 NOTE — Telephone Encounter (Signed)
I put a written px in the IN box

## 2017-05-22 NOTE — Progress Notes (Signed)
HISTORY OF PRESENT ILLNESS:  Erik [name] is a 62 y.o. male with a history of GERD complicated by esophagitis and peptic stricture requiring esophageal dilation. Also a history of choledocholithiasis with prior ERCP and common bile duct stone extraction. He is status post cholecystectomy. Last colonoscopy April 2012 with diverticulosis and hyperplastic colon polyp. Follow-up in 10 years recommended. Last seen in this office December 2016 for ongoing management of GERD. He presents today regarding the same. Patient tells me that since his last office visit he was diagnosed with diabetes and placed on oral agent. Subsequently lost 20 pounds with exercise and diet which improved his diabetes to the point of not requiring medical therapy. He tells me that he has been trying to take his PPI less frequently and has noticed breakthrough symptoms as well as some occasional mild dysphagia. GI review of systems is otherwise negative. Review of blood work from April 2018 finds CBC with hemoglobin 17.0. No interval imaging.Marland Kitchen  REVIEW OF SYSTEMS:  All non-GI ROS negative except for skin rash  Past Medical History:  Diagnosis Date  . Allergy    seasonal  . Arthritis   . Cataract    surgery  . Chronic headaches   . Colon polyps    hyperplastic  . Diverticulosis   . Eczema   . Elevated liver enzymes   . Esophageal stricture   . GERD (gastroesophageal reflux disease)    with HH  . Hyperlipidemia   . Hypertension   . Labile blood pressure   . Polycythemia   . Thrombocytopenia ([place]) 09/20/2011    Past Surgical History:  Procedure Laterality Date  . Abdominal US  2002   Negative  . BACK SURGERY     Ruptured disk with staph infection  . CATARACT EXTRACTION W/ INTRAOCULAR LENS  IMPLANT, BILATERAL    . CERVICAL DISC SURGERY     with Staph infection  . CHOLECYSTECTOMY  2007  . ESOPHAGOGASTRODUODENOSCOPY  2001   Stricture/GERD and HH  . KNEE ARTHROSCOPY     right  . UPPER GASTROINTESTINAL  ENDOSCOPY      Social History Erik [name]  reports that he quit smoking about 28 years ago. he has never used smokeless tobacco. He reports that he drinks alcohol. He reports that he does not use drugs.  family history includes Cancer in his mother; Heart disease in his father and maternal grandfather; Heart failure in his father.  Allergies  Allergen Reactions  . Shellfish Allergy Anaphylaxis       PHYSICAL EXAMINATION: Vital signs: BP 122/80   Pulse 62   Ht 6' (1.829 m)   Wt 201 lb 3.2 oz (91.3 kg)   SpO2 98%   BMI 27.29 kg/m   Constitutional: generally well-appearing, no acute distress Psychiatric: alert and oriented x3, cooperative Eyes: extraocular movements intact, anicteric, conjunctiva pink Mouth: oral pharynx moist, no lesions Neck: supple no lymphadenopathy Cardiovascular: heart regular rate and rhythm, no murmur Lungs: clear to auscultation bilaterally Abdomen: soft, nontender, nondistended, no obvious ascites, no peritoneal signs, normal bowel sounds, no organomegaly Rectal: Omitted Extremities: no clubbing, cyanosis, or lower extremity edema bilaterally Skin: no lesions on visible extremities Neuro: No focal deficits. Cranial nerves intact  ASSESSMENT:  #1. GERD complicated by esophagitis and peptic stricture requiring esophageal dilation. Patient has had breakthrough symptoms with sporadic use of his medication as well as slight recurrence of dysphagia #2. Colonoscopy April 2012 with diverticulosis and hyperplastic polyp  PLAN:  #1. Reflux precautions #2. Recommend taking  his lansoprazole 30 mg DAILY to control symptoms and reduce the risk of recurrent complications including esophagitis and symptomatic stricture. We reviewed this. He understands and agrees. His prescription has been refilled  #3. Repeat screening colonoscopy around 2022 #4. Routine GI office follow-up in 2 years. Contact the office in the interim for any questions or problems. He  agrees 15 minutes spent face-to-face with the patient. Greater than 50% a time use for counseling regarding management of his complicated GERD and the importance of daily medical compliance

## 2017-05-22 NOTE — Telephone Encounter (Signed)
We don't have coupon but I did receive a refill request for the freestyle libre sensor kit, saying pt also needs 14 day readers and sensors, please advise   [facility].

## 2017-05-23 ENCOUNTER — Telehealth: Payer: Self-pay | Admitting: *Deleted

## 2017-05-23 NOTE — Telephone Encounter (Signed)
Order faxed to [place]

## 2017-05-23 NOTE — Telephone Encounter (Signed)
Copied from [place] [id number]. Topic: General - Other >> May 22, 2017  4:12 PM Lolita Rieger, [place] wrote: Reason for CRM:Pt currently uses a 10 day reader. Pt would like a coupon for a 14 day reader along with sensors. If a coupon is not available pt would need a prescription for his 10 day sensors sent to his pharmacy. >> May 23, 2017 12:55 PM Oliver Pila B wrote: [facility] the [facility] 10 day reader and sensor being changed to a 14 day b/c they havent heard back from the practice yet, contact pharmacy if needed

## 2017-05-23 NOTE — Telephone Encounter (Signed)
I wrote him for the 10 day sensors- now I am confused Exactly what does he need?

## 2017-05-24 NOTE — Telephone Encounter (Signed)
Rx was sent they just hadn't realized it before they called

## 2017-05-30 ENCOUNTER — Telehealth: Payer: Self-pay | Admitting: Family Medicine

## 2017-05-30 NOTE — Telephone Encounter (Signed)
Left message for pt to return call to the office. Refills on requested sensors were sent to [facility] on [facility]. Pt [facility] [place] in order to have prescription transferred to requested pharmacy.

## 2017-05-30 NOTE — Telephone Encounter (Signed)
Pt states that he has a coupon for the 14 day reader now. He needs an Rx sent to the pharmacy for the 14 day reader and sensors.  [id number] [address] [address], [place] [phone] (Phone) [phone] (Fax)

## 2017-05-30 NOTE — Telephone Encounter (Signed)
Copied from [place]. Topic: General - Other >> May 30, 2017  4:13 PM Darl Householder, RMA wrote: Reason for CRM: Medication refill request for Continuous Blood Gluc Sensor ([place]) MISC to be sent to [facility]

## 2017-05-30 NOTE — Telephone Encounter (Signed)
Pt would like a 90 day supply

## 2017-05-31 NOTE — Telephone Encounter (Signed)
Faxed to pharm/thx dmf 

## 2017-05-31 NOTE — Telephone Encounter (Signed)
I hand wrote to fax  In IN box

## 2017-05-31 NOTE — Telephone Encounter (Signed)
I am not sure of how to refill pts requested sensors. pls advise

## 2017-06-01 ENCOUNTER — Telehealth: Payer: Self-pay | Admitting: Family Medicine

## 2017-06-01 NOTE — Telephone Encounter (Signed)
After hours service contacted this provider in regards to rx for freestyle libre sensor and meter.  Per chart review this appears to have been addressed by pt's pcp.  The rx was initially sent to the [facility] on [address], then [facility] a [facility] in [place].  Pt should contact pharmacy in regards to this or contact the office on Monday for further info.  Grier Mitts, MD

## 2017-06-03 ENCOUNTER — Telehealth: Payer: Self-pay

## 2017-06-03 NOTE — Telephone Encounter (Signed)
PLEASE NOTE: All timestamps contained within this report are represented as [place] Standard Time. CONFIDENTIALTY NOTICE: This fax transmission is intended only for the addressee. It contains information that is legally privileged, confidential or otherwise protected from use or disclosure. If you are not the intended recipient, you are strictly prohibited from reviewing, disclosing, copying using or disseminating any of this information or taking any action in reliance on or regarding this information. If you have received this fax in error, please notify us immediately by telephone so that we can arrange for its return to [place]. Phone: [phone], Toll-Free: [phone], Fax: [phone] Page: 1 of 2 Call Id: [id number] [place] Patient Name: Erik [name] Gender: Male DOB: April 25, 1955 Age: 62 Y 9 M 21 D Return Phone Number: [phone] (Primary), [phone] (Secondary) Address: City/State/Zip: [place] [address] Client [facility] Night - Client Client Site [place] [facility], Roque Lias - MD Contact Type Call Who Is Calling Patient / Member / Family / Caregiver Call Type Triage / Clinical Relationship To Patient Self Return Phone Number [phone] (Primary) Chief Complaint Prescription Refill or Medication Request (non symptomatic) Reason for Call Medication Question / Request Initial Comment Caller states needs Rx called in to test BS with no symptoms now Translation No Nurse Assessment Nurse: Wynetta Emery, RN, Baker Janus Date/Time (Eastern Time): 06/01/2017 11:27:08 AM Confirm and document reason for call. If symptomatic, describe symptoms. ---Annie Main did used the [place] 10 day sensor - was given rx for 14 day sensor but has no RX for the reader needs to have RX sent [facility] (they have the sensors) Does the patient have any new  or worsening symptoms? ---No Please document clinical information provided and list any resource used. ---He is doing fine. needs 14 day reader called in will check the MD on call Guidelines Guideline Title Affirmed Question Affirmed Notes Nurse Date/Time ([place] Time) Disp. Time Eilene Ghazi Time) Disposition Final User 06/01/2017 11:23:11 AM Send To Call Back Waiting For Nurse Kathlynn Grate 06/01/2017 11:32:24 AM Paged On Call back to [facility], RNBaker Janus 06/01/2017 11:57:36 AM Pharmacy Call Wynetta Emery, RN, Baker Janus Reason: [facility] does not use them anymore. 06/01/2017 11:58:02 AM Pharmacy Call Wynetta Emery, RN, Baker Janus Reason: [facility] and [facility] will [facility] him the reader 06/01/2017 11:58:18 AM Clinical Call Yes Wynetta Emery, RN, Baker Janus PLEASE NOTE: All timestamps contained within this report are represented as [place] Standard Time. CONFIDENTIALTY NOTICE: This fax transmission is intended only for the addressee. It contains information that is legally privileged, confidential or otherwise protected from use or disclosure. If you are not the intended recipient, you are strictly prohibited from reviewing, disclosing, copying using or disseminating any of this information or taking any action in reliance on or regarding this information. If you have received this fax in error, please notify us immediately by telephone so that we can arrange for its return to [place]. Phone: [phone], Toll-Free: [phone], Fax: [phone] Page: 2 of 2 Call Id: [id number] Comments User: Kathlynn Grate Date/Time Eilene Ghazi Time): 06/01/2017 11:22:54 AM CBWN-- Call from Vicente Males at [facility] request to clarify an rx. She says they called last nightbut they were closed when the nurse called back. User: Michele Rockers, RN Date/Time Eilene Ghazi Time): 06/01/2017 11:51:01 AM NOTE Dr advised they hand wrote for sensor and reader sent to [facility] they only have the sensor will  dispense the reader on  rx sent yesterday. User: Michele Rockers, RN Date/Time Eilene Ghazi Time): 06/01/2017 11:53:06 AM NOTE Please note that the Orlene Plum system is 10 day and 14 day and has to be ordered for sensor and the reader - not just the sensor or not just the reader but both. when submitting order in future please include 14 day or 10 day and that you want the sensor and the reader ordered together. Thanks User: Michele Rockers, RN Date/Time Eilene Ghazi Time): 06/01/2017 11:57:03 AM NOTE FYI THIS is only for the first time sensor and reader thereafter it is just the sensors to be ordered. thank Paging DoctorName Phone DateTime Result/Outcome Message Type Notes Grier Mitts- MD [phone] 06/01/2017 11:32:24 AM Paged On Call Back to Call Center Doctor Paged Grier Mitts- MD 06/01/2017 11:40:25 AM Spoke with On Call - Outcome Notification Message Result NOTE gave MD the information provided by the Patient; MD Directives: states went to [facility] in [place]. the [facility]

## 2017-06-03 NOTE — Telephone Encounter (Signed)
On call doc sent an Rx for reader so pt is okay, but pharmacy said FYI anytime pt's uses the [place] meter and need a refill you have to send in an Rx for the sensor and reader because it's 2 separate components on the machine that will need refills

## 2017-06-03 NOTE — Telephone Encounter (Signed)
Thanks for letting me know for the future Sounds like it was taken care of for now

## 2017-06-03 NOTE — Telephone Encounter (Signed)
PLEASE NOTE: All timestamps contained within this report are represented as [place] Standard Time. CONFIDENTIALTY NOTICE: This fax transmission is intended only for the addressee. It contains information that is legally privileged, confidential or otherwise protected from use or disclosure. If you are not the intended recipient, you are strictly prohibited from reviewing, disclosing, copying using or disseminating any of this information or taking any action in reliance on or regarding this information. If you have received this fax in error, please notify us immediately by telephone so that we can arrange for its return to [place]. Phone: [phone], Toll-Free: [phone], Fax: [phone] Page: 1 of 1 Call Id: [id number] [place] Patient Name: Erik [name] Gender: Male DOB: April 03, 1956 Age: 59 Y 9 M 21 D Return Phone Number: Address: City/State/Zip: [place] Client [facility] Client Site [place] [facility], [place] Contact Type Call Who Is Calling Pharmacy Call Type Pharmacy Send to RN Chief Complaint Paging or Request for Consult Reason for Call Request to speak to Physician Initial Comment Caller is Vicente Males from [place]. They have a caller there which is upset. The patient is missing the 14 day reader. The patient has communicated with the doctor and pharmacy and the issue hasn't been resolved. Dr. Glori Bickers Additional Comment [facility] Pharmacist Name [place] Number [phone] Translation No Nurse Assessment Guidelines Guideline Title Affirmed Question Affirmed Notes Nurse Date/Time Eilene Ghazi Time) Disp. Time Eilene Ghazi Time) Disposition Final User 05/31/2017 10:20:01 PM Clinical Call Yes McMurray, RN, Christine Comments User: Denny Peon, RN Date/Time Eilene Ghazi Time): 05/31/2017 10:19:54  PM [facility] Pharmacy is now closed. Instructed to call back if needed.

## 2017-06-03 NOTE — Telephone Encounter (Signed)
I have sent several things and am unfamiliar with wording he needs Please ask him exactly what he needs written for and I will do it  Thanks

## 2017-06-21 DIAGNOSIS — L814 Other melanin hyperpigmentation: Secondary | ICD-10-CM | POA: Diagnosis not present

## 2017-06-21 DIAGNOSIS — L570 Actinic keratosis: Secondary | ICD-10-CM | POA: Diagnosis not present

## 2017-06-21 DIAGNOSIS — D1801 Hemangioma of skin and subcutaneous tissue: Secondary | ICD-10-CM | POA: Diagnosis not present

## 2017-06-21 DIAGNOSIS — L309 Dermatitis, unspecified: Secondary | ICD-10-CM | POA: Diagnosis not present

## 2017-06-21 DIAGNOSIS — D225 Melanocytic nevi of trunk: Secondary | ICD-10-CM | POA: Diagnosis not present

## 2017-06-21 DIAGNOSIS — L820 Inflamed seborrheic keratosis: Secondary | ICD-10-CM | POA: Diagnosis not present

## 2017-08-12 ENCOUNTER — Telehealth: Payer: Self-pay

## 2017-08-12 NOTE — Telephone Encounter (Signed)
Erik [name] pharmacist from [facility] diabetic teaching program needs pt most recent A!C. Info given.

## 2017-08-13 ENCOUNTER — Encounter: Payer: BLUE CROSS/BLUE SHIELD | Admitting: Family Medicine

## 2017-08-18 ENCOUNTER — Telehealth: Payer: Self-pay | Admitting: Family Medicine

## 2017-08-18 DIAGNOSIS — E7800 Pure hypercholesterolemia, unspecified: Secondary | ICD-10-CM

## 2017-08-18 DIAGNOSIS — I10 Essential (primary) hypertension: Secondary | ICD-10-CM

## 2017-08-18 DIAGNOSIS — R972 Elevated prostate specific antigen [PSA]: Secondary | ICD-10-CM

## 2017-08-18 DIAGNOSIS — E11649 Type 2 diabetes mellitus with hypoglycemia without coma: Secondary | ICD-10-CM

## 2017-08-18 DIAGNOSIS — D696 Thrombocytopenia, unspecified: Secondary | ICD-10-CM

## 2017-08-18 NOTE — Telephone Encounter (Signed)
-----   Message from Ellamae Sia sent at 08/14/2017  5:21 PM EDT ----- Regarding: Lab orders for Thursday, 5.9.19 Patient is scheduled for CPX labs, please order future labs, Thanks , Karna Christmas

## 2017-08-22 ENCOUNTER — Other Ambulatory Visit (INDEPENDENT_AMBULATORY_CARE_PROVIDER_SITE_OTHER): Payer: BLUE CROSS/BLUE SHIELD

## 2017-08-22 DIAGNOSIS — I10 Essential (primary) hypertension: Secondary | ICD-10-CM

## 2017-08-22 DIAGNOSIS — E7800 Pure hypercholesterolemia, unspecified: Secondary | ICD-10-CM

## 2017-08-22 DIAGNOSIS — E11649 Type 2 diabetes mellitus with hypoglycemia without coma: Secondary | ICD-10-CM

## 2017-08-22 DIAGNOSIS — D696 Thrombocytopenia, unspecified: Secondary | ICD-10-CM

## 2017-08-22 LAB — LIPID PANEL
Cholesterol: 141 mg/dL (ref 0–200)
HDL: 34.6 mg/dL — AB (ref >39.00)
LDL Cholesterol: 82 mg/dL (ref 0–99)
NonHDL: 106.59
Total CHOL/HDL Ratio: 4
Triglycerides: 124 mg/dL (ref 0.0–149.0)
VLDL: 24.8 mg/dL (ref 0.0–40.0)

## 2017-08-22 LAB — CBC WITH DIFFERENTIAL/PLATELET
Basophils Absolute: 0.1 10*3/uL (ref 0.0–0.1)
Basophils Relative: 1.3 % (ref 0.0–3.0)
Eosinophils Absolute: 0.4 10*3/uL (ref 0.0–0.7)
Eosinophils Relative: 6.4 % — ABNORMAL HIGH (ref 0.0–5.0)
HEMATOCRIT: 48.1 % (ref 39.0–52.0)
HEMOGLOBIN: 17 g/dL (ref 13.0–17.0)
LYMPHS ABS: 1.3 10*3/uL (ref 0.7–4.0)
LYMPHS PCT: 21.4 % (ref 12.0–46.0)
MCHC: 35.3 g/dL (ref 30.0–36.0)
MCV: 83.5 fl (ref 78.0–100.0)
MONOS PCT: 11.3 % (ref 3.0–12.0)
Monocytes Absolute: 0.7 10*3/uL (ref 0.1–1.0)
Neutro Abs: 3.6 10*3/uL (ref 1.4–7.7)
Neutrophils Relative %: 59.6 % (ref 43.0–77.0)
Platelets: 127 10*3/uL — ABNORMAL LOW (ref 150.0–400.0)
RBC: 5.76 Mil/uL (ref 4.22–5.81)
RDW: 13.5 % (ref 11.5–15.5)
WBC: 6 10*3/uL (ref 4.0–10.5)

## 2017-08-22 LAB — COMPREHENSIVE METABOLIC PANEL
ALBUMIN: 3.9 g/dL (ref 3.5–5.2)
ALK PHOS: 115 U/L (ref 39–117)
ALT: 19 U/L (ref 0–53)
AST: 24 U/L (ref 0–37)
BILIRUBIN TOTAL: 0.6 mg/dL (ref 0.2–1.2)
BUN: 21 mg/dL (ref 6–23)
CALCIUM: 9.5 mg/dL (ref 8.4–10.5)
CO2: 32 mEq/L (ref 19–32)
Chloride: 101 mEq/L (ref 96–112)
Creatinine, Ser: 1.19 mg/dL (ref 0.40–1.50)
GFR: 65.62 mL/min (ref >60.00)
GLUCOSE: 115 mg/dL — AB (ref 70–99)
Potassium: 3.7 mEq/L (ref 3.5–5.1)
Sodium: 140 mEq/L (ref 135–145)
TOTAL PROTEIN: 6.2 g/dL (ref 6.0–8.3)

## 2017-08-22 LAB — HEMOGLOBIN A1C: Hgb A1c MFr Bld: 6 % (ref 4.6–6.5)

## 2017-08-22 LAB — TSH: TSH: 2.3 u[IU]/mL (ref 0.35–4.50)

## 2017-08-26 ENCOUNTER — Encounter: Payer: Self-pay | Admitting: Family Medicine

## 2017-08-26 ENCOUNTER — Ambulatory Visit (INDEPENDENT_AMBULATORY_CARE_PROVIDER_SITE_OTHER): Payer: BLUE CROSS/BLUE SHIELD | Admitting: Family Medicine

## 2017-08-26 VITALS — BP 126/82 | HR 73 | Temp 97.4°F | Ht 71.75 in | Wt 196.5 lb

## 2017-08-26 DIAGNOSIS — Z0000 Encounter for general adult medical examination without abnormal findings: Secondary | ICD-10-CM | POA: Diagnosis not present

## 2017-08-26 DIAGNOSIS — R972 Elevated prostate specific antigen [PSA]: Secondary | ICD-10-CM

## 2017-08-26 DIAGNOSIS — I10 Essential (primary) hypertension: Secondary | ICD-10-CM | POA: Diagnosis not present

## 2017-08-26 DIAGNOSIS — E11649 Type 2 diabetes mellitus with hypoglycemia without coma: Secondary | ICD-10-CM

## 2017-08-26 DIAGNOSIS — E7800 Pure hypercholesterolemia, unspecified: Secondary | ICD-10-CM

## 2017-08-26 DIAGNOSIS — D696 Thrombocytopenia, unspecified: Secondary | ICD-10-CM | POA: Diagnosis not present

## 2017-08-26 DIAGNOSIS — D751 Secondary polycythemia: Secondary | ICD-10-CM

## 2017-08-26 MED ORDER — LANSOPRAZOLE 30 MG PO CPDR: 30.0000 mg | DELAYED_RELEASE_CAPSULE | Freq: Every day | ORAL | 3 refills | Status: DC

## 2017-08-26 MED ORDER — ATORVASTATIN CALCIUM 10 MG PO TABS: 10.0000 mg | ORAL_TABLET | Freq: Every day | ORAL | 3 refills | Status: DC

## 2017-08-26 MED ORDER — FREESTYLE LIBRE SENSOR SYSTEM MISC: 3 refills | Status: DC

## 2017-08-26 MED ORDER — HYDROCHLOROTHIAZIDE 25 MG PO TABS: 25.0000 mg | ORAL_TABLET | Freq: Every day | ORAL | 3 refills | Status: DC

## 2017-08-26 NOTE — Assessment & Plan Note (Signed)
Platelet count is 127  Fairly stable  No bruising/bleeding  Continue to follow

## 2017-08-26 NOTE — Progress Notes (Signed)
Subjective:    Patient ID: Erik [name], male    DOB: 1955/11/21, 62 y.o.   MRN: [id number]  HPI Here for health maintenance exam and to review chronic medical problems    Working hard/long hours  Will get vacation this summer   R knee occ bothers him - will get it checked out after vacation   Wt Readings from Last 3 Encounters:  08/26/17 196 lb 8 oz (89.1 kg)  05/22/17 201 lb 3.2 oz (91.3 kg)  01/07/17 195 lb (88.5 kg)  he is eating very well  No time for exercise with schedule /work     Does play paint ball  26.84 kg/m   pna vaccine - no hx of pneumonia in the past  Will wait until 69   Flu shot 9/18  Colonoscopy 4/12- was normal - 10 year recall per pt   Tetanus shot 12/16  Prostate health Lab Results  Component Value Date   PSA 4.62 (H) 03/17/2015   PSA 4.56 (H) 05/02/2012   PSA 1.36 11/28/2009  sees urology - goes in the fall  They continue to obs him  No voiding symptoms    bp is stable today  No cp or palpitations or headaches or edema  No side effects to medicines  BP Readings from Last 3 Encounters:  08/26/17 126/82  05/22/17 122/80  01/07/17 126/78     DM2 Lab Results  Component Value Date   HGBA1C 6.0 08/22/2017  down from 6.1 Had a short time with low glucose- but thinks it was a sensor issue with his machine but he felt fine  Controlled with lifestyle/limits his carbs   Keeps peanuts in the truck  Eye exam was 4 mo ago -no diabetic change   Eye exam  Lab Results  Component Value Date   MICROALBUR <0.7 01/07/2017    Hyperlipidemia Lab Results  Component Value Date   CHOL 141 08/22/2017   CHOL 145 01/07/2017   CHOL 149 08/03/2016   Lab Results  Component Value Date   HDL 34.60 (L) 08/22/2017   HDL 37.80 (L) 01/07/2017   HDL 31.50 (L) 08/03/2016   Lab Results  Component Value Date   LDLCALC 82 08/22/2017   LDLCALC 91 01/07/2017   LDLCALC 77 03/17/2015   Lab Results  Component Value Date   TRIG 124.0 08/22/2017   TRIG  82.0 01/07/2017   TRIG 336.0 (H) 08/03/2016   Lab Results  Component Value Date   CHOLHDL 4 08/22/2017   CHOLHDL 4 01/07/2017   CHOLHDL 5 08/03/2016   Lab Results  Component Value Date   LDLDIRECT 56.0 08/03/2016   LDLDIRECT 75.6 05/02/2012     Polycythemia (from exp to exhaust) and low platelet Lab Results  Component Value Date   WBC 6.0 08/22/2017   HGB 17.0 08/22/2017   HCT 48.1 08/22/2017   MCV 83.5 08/22/2017   PLT 127.0 (L) 08/22/2017  last platelet ct was 130  No bleeding/bruising or clots    Lab Results  Component Value Date   CREATININE 1.19 08/22/2017   BUN 21 08/22/2017   NA 140 08/22/2017   K 3.7 08/22/2017   CL 101 08/22/2017   CO2 32 08/22/2017   Lab Results  Component Value Date   ALT 19 08/22/2017   AST 24 08/22/2017   ALKPHOS 115 08/22/2017   BILITOT 0.6 08/22/2017    Lab Results  Component Value Date   TSH 2.30 08/22/2017    Patient Active Problem List  Diagnosis Date Noted  . Essential hypertension 03/21/2015  . Diabetes mellitus type 2, uncontrolled ([place]) 05/09/2012  . Elevated PSA 05/09/2012  . Routine general medical examination at a health care facility 05/01/2012  . Prostate cancer screening 05/01/2012  . Thrombocytopenia ([place]) 09/20/2011  . Gastritis 09/08/2011  . Polycythemia 08/29/2011  . HEMOCCULT POSITIVE STOOL 06/23/2010  . Hyperlipidemia 02/28/2009   Past Medical History:  Diagnosis Date  . Allergy    seasonal  . Arthritis   . Cataract    surgery  . Chronic headaches   . Colon polyps    hyperplastic  . Diverticulosis   . Eczema   . Elevated liver enzymes   . Esophageal stricture   . GERD (gastroesophageal reflux disease)    with HH  . Hyperlipidemia   . Hypertension   . Labile blood pressure   . Polycythemia   . Thrombocytopenia ([place]) 09/20/2011   Past Surgical History:  Procedure Laterality Date  . Abdominal US  2002   Negative  . BACK SURGERY     Ruptured disk with staph infection  . CATARACT  EXTRACTION W/ INTRAOCULAR LENS  IMPLANT, BILATERAL    . CERVICAL DISC SURGERY     with Staph infection  . CHOLECYSTECTOMY  2007  . ESOPHAGOGASTRODUODENOSCOPY  2001   Stricture/GERD and HH  . KNEE ARTHROSCOPY     right  . UPPER GASTROINTESTINAL ENDOSCOPY     Social History   Tobacco Use  . Smoking status: Former Smoker    Last attempt to quit: 04/16/1989    Years since quitting: 28.3  . Smokeless tobacco: Never Used  Substance Use Topics  . Alcohol use: Yes    Alcohol/week: 0.0 oz    Comment: Rare, almost none.  . Drug use: No   Family History  Problem Relation Age of Onset  . Heart disease Father        Heart problems, CABG  . Heart failure Father   . Cancer Mother        breast CA  . Heart disease Maternal Grandfather        MI   Allergies  Allergen Reactions  . Shellfish Allergy Anaphylaxis   Current Outpatient Medications on File Prior to Visit  Medication Sig Dispense Refill  . aspirin 81 MG tablet Take 81 mg by mouth daily.     . Continuous Blood Gluc Sensor ([place]) MISC use to TEST BLOOD GLUCOSE TWICE DAILY AS NEEDED 3 each 3  . EPINEPHrine 0.3 mg/0.3 mL IJ SOAJ injection Inject 0.3 mLs (0.3 mg total) into the muscle once. 2 Device 1  . fluocinonide cream (LIDEX) 8.18 % Apply 1 application topically as needed.    Marland Kitchen glucose blood (FREESTYLE LITE) test strip Use to check glucose BID and PRN (dx. E11.65) 100 each 2  . ibuprofen (ADVIL) 200 MG tablet Take 200 mg by mouth as needed.     No current facility-administered medications on file prior to visit.     Review of Systems  Constitutional: Negative for activity change, appetite change, fatigue, fever and unexpected weight change.  HENT: Negative for congestion, rhinorrhea, sore throat and trouble swallowing.   Eyes: Negative for pain, redness, itching and visual disturbance.  Respiratory: Negative for cough, chest tightness, shortness of breath and wheezing.   Cardiovascular: Negative for  chest pain and palpitations.  Gastrointestinal: Negative for abdominal pain, blood in stool, constipation, diarrhea and nausea.  Endocrine: Negative for cold intolerance, heat intolerance, polydipsia and polyuria.  Genitourinary: Negative for difficulty urinating, dysuria, frequency and urgency.  Musculoskeletal: Negative for arthralgias, joint swelling and myalgias.  Skin: Negative for pallor and rash.  Neurological: Negative for dizziness, tremors, weakness, numbness and headaches.  Hematological: Negative for adenopathy. Does not bruise/bleed easily.  Psychiatric/Behavioral: Negative for decreased concentration and dysphoric mood. The patient is not nervous/anxious.        Objective:   Physical Exam  Constitutional: He appears well-developed and well-nourished. No distress.  Well appearing   HENT:  Head: Normocephalic and atraumatic.  Right Ear: External ear normal.  Left Ear: External ear normal.  Nose: Nose normal.  Mouth/Throat: Oropharynx is clear and moist.  Eyes: Pupils are equal, round, and reactive to light. Conjunctivae and EOM are normal. Right eye exhibits no discharge. Left eye exhibits no discharge. No scleral icterus.  Neck: Normal range of motion. Neck supple. No JVD present. Carotid bruit is not present. No thyromegaly present.  Cardiovascular: Normal rate, regular rhythm, normal heart sounds and intact distal pulses. Exam reveals no gallop.  Pulmonary/Chest: Effort normal and breath sounds normal. No respiratory distress. He has no wheezes. He exhibits no tenderness.  Abdominal: Soft. Bowel sounds are normal. He exhibits no distension, no abdominal bruit and no mass. There is no tenderness.  Musculoskeletal: He exhibits no edema or tenderness.  Lymphadenopathy:    He has no cervical adenopathy.  Neurological: He is alert. He has normal reflexes. No cranial nerve deficit. He exhibits normal muscle tone. Coordination normal.  Skin: Skin is warm and dry. No rash noted.  No erythema. No pallor.  Solar lentigines diffusely  Some SKs  Psychiatric: He has a normal mood and affect.  Pleasant  Mood is good           Assessment & Plan:   Problem List Items Addressed This Visit      Cardiovascular and Mediastinum   Essential hypertension    bp in fair control at this time  BP Readings from Last 1 Encounters:  08/26/17 126/82   No changes needed Most recent labs reviewed  Disc lifstyle change with low sodium diet and exercise        Relevant Medications   hydrochlorothiazide (HYDRODIURIL) 25 MG tablet   atorvastatin (LIPITOR) 10 MG tablet     Endocrine   Diabetes mellitus type 2, uncontrolled ([place])    Lab Results  Component Value Date   HGBA1C 6.0 08/22/2017   This is further improved Commended on good habits Also good foot care  Sent for eye exam  Lab Results  Component Value Date   MICROALBUR <0.7 01/07/2017    Continue current medicines  F/u 6 mo       Relevant Medications   atorvastatin (LIPITOR) 10 MG tablet     Other   Elevated PSA    Lab Results  Component Value Date   PSA 4.62 (H) 03/17/2015   PSA 4.56 (H) 05/02/2012   PSA 1.36 11/28/2009   Will continue f/u and obs from urology      Hyperlipidemia    Disc goals for lipids and reasons to control them Rev last labs with pt Rev low sat fat diet in detail  In light of DM2 will add low dose statin- atorvastatin 10 mg  Disc poss side eff-update if any problems        Relevant Medications   hydrochlorothiazide (HYDRODIURIL) 25 MG tablet   atorvastatin (LIPITOR) 10 MG tablet   Polycythemia    Has seen hematology  From breathing  auto exhaust at work  Enc to donate blood frequently  No clotting /bleeding issues       Routine general medical examination at a health care facility - Primary    Reviewed health habits including diet and exercise and skin cancer prevention Reviewed appropriate screening tests for age  Also reviewed health mt list, fam hx and  immunization status , as well as social and family history   See HPI Labs rev  Enc healthy habits  Start atorvastatin and update        Thrombocytopenia (HCC)    Platelet count is 127  Fairly stable  No bruising/bleeding  Continue to follow

## 2017-08-26 NOTE — Assessment & Plan Note (Signed)
Lab Results  Component Value Date   HGBA1C 6.0 08/22/2017   This is further improved Commended on good habits Also good foot care  Sent for eye exam  Lab Results  Component Value Date   MICROALBUR <0.7 01/07/2017    Continue current medicines  F/u 6 mo

## 2017-08-26 NOTE — Assessment & Plan Note (Signed)
Lab Results  Component Value Date   PSA 4.62 (H) 03/17/2015   PSA 4.56 (H) 05/02/2012   PSA 1.36 11/28/2009   Will continue f/u and obs from urology

## 2017-08-26 NOTE — Assessment & Plan Note (Signed)
Has seen hematology  From breathing auto exhaust at work  Enc to donate blood frequently  No clotting /bleeding issues

## 2017-08-26 NOTE — Patient Instructions (Addendum)
I'm glad you are doing well   Keep up the good diet  Exercise when you can   We will send for your eye exam   If you have any problems or side effects from the atorvastatin please let us know

## 2017-08-26 NOTE — Assessment & Plan Note (Signed)
Disc goals for lipids and reasons to control them Rev last labs with pt Rev low sat fat diet in detail  In light of DM2 will add low dose statin- atorvastatin 10 mg  Disc poss side eff-update if any problems

## 2017-08-26 NOTE — Assessment & Plan Note (Signed)
Reviewed health habits including diet and exercise and skin cancer prevention Reviewed appropriate screening tests for age  Also reviewed health mt list, fam hx and immunization status , as well as social and family history   See HPI Labs rev  Enc healthy habits  Start atorvastatin and update

## 2017-08-26 NOTE — Assessment & Plan Note (Signed)
bp in fair control at this time  BP Readings from Last 1 Encounters:  08/26/17 126/82   No changes needed Most recent labs reviewed  Disc lifstyle change with low sodium diet and exercise

## 2017-08-30 ENCOUNTER — Encounter: Payer: Self-pay | Admitting: Family Medicine

## 2017-10-28 ENCOUNTER — Telehealth: Payer: Self-pay | Admitting: Family Medicine

## 2017-10-28 MED ORDER — GLUCOSE BLOOD VI STRP
ORAL_STRIP | 1 refills | Status: DC
Start: 2017-10-28 — End: 2018-02-28

## 2017-10-28 NOTE — Telephone Encounter (Signed)
Copied from [place] [id number]. Topic: Quick Communication - Rx Refill/Question >> Oct 28, 2017 10:15 AM Percell Belt A wrote: Medication: glucose blood (FREESTYLE LITE) test strip [[id number]]   Has the patient contacted their pharmacy? No  (Agent: If no, request that the patient contact the pharmacy for the refill.) (Agent: If yes, when and what did the pharmacy advise?)  Preferred Pharmacy (with phone number or street name): [facility] [employer]: Please be advised that RX refills may take up to 3 business days. We ask that you follow-up with your pharmacy.

## 2017-11-08 ENCOUNTER — Telehealth: Payer: Self-pay | Admitting: Family Medicine

## 2017-11-08 NOTE — Telephone Encounter (Signed)
Copied from [place] [id number]. Topic: Quick Communication - See Telephone Encounter >> Nov 08, 2017 10:07 AM Rutherford Nail, NT wrote: CRM for notification. See Telephone encounter for: 11/08/17. Patient's wife calling and states that will be traveling in October. States that the patient is needing a travel letter to be able to get on the airplane stating that he has a medical device (continuous Blood glucose sensor) that he has to wear. Please advise. CB#: [phone]

## 2017-11-11 NOTE — Telephone Encounter (Signed)
Left VM letting pt/wife know letter is ready for pick up

## 2017-11-11 NOTE — Telephone Encounter (Signed)
Printed and in IN box  

## 2017-12-27 DIAGNOSIS — R972 Elevated prostate specific antigen [PSA]: Secondary | ICD-10-CM | POA: Diagnosis not present

## 2018-01-06 DIAGNOSIS — M25561 Pain in right knee: Secondary | ICD-10-CM | POA: Diagnosis not present

## 2018-01-07 DIAGNOSIS — H43393 Other vitreous opacities, bilateral: Secondary | ICD-10-CM | POA: Diagnosis not present

## 2018-01-23 ENCOUNTER — Telehealth: Payer: Self-pay | Admitting: *Deleted

## 2018-01-23 NOTE — Telephone Encounter (Signed)
What do they need letter to say?   I know he likes the free style better -is there a medical reason he can only use that or does he just need to pay the extra?

## 2018-01-23 NOTE — Telephone Encounter (Signed)
Copied from [place] [id number]. Topic: General - Other >> Jan 23, 2018  9:33 AM Janace Aris A wrote: Reason for CRM: patient's wife called in wanting to let us know that [facility] are requesting authorization for the freestyle libre test strips, which is what the patient prefers. Needs a letter for this specific medication from the Dr.   Please advise  Mrs. Dizdarevic did say she wont be in for the day but will like a message.

## 2018-01-23 NOTE — Telephone Encounter (Signed)
Wife said she had the same problem and her doctor just wrote a letter saying this is what she recommends due to the effectiveness of using the new meter. The [place] meter is the one that has the sensor attached to his arm and he can scan it anytime and check his BS multiple times a day so he can manage his BS a lot better then using the old test strips where he has to prick his finger once or twice daily and that's it. Wife said that the insurance wants him to go back to an old meter with test strips but if you write a letter saying that it's more effective for him to use the sensor due to him being able to check BS multiple times daily and manage his DM better then his insurance may cover the [place] meter because that's what they did for pt's wife

## 2018-01-24 NOTE — Telephone Encounter (Signed)
Called wife's # and no answer and it kept ringing so called pt and left VM letting him know letter is ready for pick up

## 2018-01-24 NOTE — Telephone Encounter (Signed)
Letter printed in IN box

## 2018-02-24 ENCOUNTER — Other Ambulatory Visit (INDEPENDENT_AMBULATORY_CARE_PROVIDER_SITE_OTHER): Payer: BLUE CROSS/BLUE SHIELD

## 2018-02-24 DIAGNOSIS — D696 Thrombocytopenia, unspecified: Secondary | ICD-10-CM | POA: Diagnosis not present

## 2018-02-24 DIAGNOSIS — E11649 Type 2 diabetes mellitus with hypoglycemia without coma: Secondary | ICD-10-CM | POA: Diagnosis not present

## 2018-02-24 DIAGNOSIS — E7800 Pure hypercholesterolemia, unspecified: Secondary | ICD-10-CM | POA: Diagnosis not present

## 2018-02-24 DIAGNOSIS — D751 Secondary polycythemia: Secondary | ICD-10-CM

## 2018-02-24 DIAGNOSIS — I10 Essential (primary) hypertension: Secondary | ICD-10-CM | POA: Diagnosis not present

## 2018-02-24 LAB — CBC WITH DIFFERENTIAL/PLATELET
BASOS ABS: 0.1 10*3/uL (ref 0.0–0.1)
Basophils Relative: 1.1 % (ref 0.0–3.0)
EOS ABS: 0.3 10*3/uL (ref 0.0–0.7)
Eosinophils Relative: 5.1 % — ABNORMAL HIGH (ref 0.0–5.0)
HCT: 49.4 % (ref 39.0–52.0)
Hemoglobin: 17.2 g/dL — ABNORMAL HIGH (ref 13.0–17.0)
LYMPHS ABS: 1.1 10*3/uL (ref 0.7–4.0)
Lymphocytes Relative: 18.5 % (ref 12.0–46.0)
MCHC: 34.9 g/dL (ref 30.0–36.0)
MCV: 84.8 fl (ref 78.0–100.0)
MONO ABS: 0.6 10*3/uL (ref 0.1–1.0)
Monocytes Relative: 9.8 % (ref 3.0–12.0)
NEUTROS ABS: 3.8 10*3/uL (ref 1.4–7.7)
NEUTROS PCT: 65.5 % (ref 43.0–77.0)
PLATELETS: 132 10*3/uL — AB (ref 150.0–400.0)
RBC: 5.82 Mil/uL — ABNORMAL HIGH (ref 4.22–5.81)
RDW: 13.5 % (ref 11.5–15.5)
WBC: 5.9 10*3/uL (ref 4.0–10.5)

## 2018-02-24 LAB — MICROALBUMIN / CREATININE URINE RATIO
CREATININE, U: 91.1 mg/dL
MICROALB/CREAT RATIO: 0.8 mg/g (ref 0.0–30.0)

## 2018-02-24 LAB — HEMOGLOBIN A1C: Hgb A1c MFr Bld: 6.3 % (ref 4.6–6.5)

## 2018-02-24 LAB — LIPID PANEL
CHOL/HDL RATIO: 3
Cholesterol: 103 mg/dL (ref 0–200)
HDL: 37.4 mg/dL — AB (ref >39.00)
LDL Cholesterol: 39 mg/dL (ref 0–99)
NONHDL: 65.44
Triglycerides: 130 mg/dL (ref 0.0–149.0)
VLDL: 26 mg/dL (ref 0.0–40.0)

## 2018-02-28 ENCOUNTER — Encounter: Payer: Self-pay | Admitting: Family Medicine

## 2018-02-28 ENCOUNTER — Ambulatory Visit (INDEPENDENT_AMBULATORY_CARE_PROVIDER_SITE_OTHER): Payer: BLUE CROSS/BLUE SHIELD | Admitting: Family Medicine

## 2018-02-28 VITALS — BP 122/70 | HR 69 | Temp 97.4°F | Ht 71.75 in | Wt 201.5 lb

## 2018-02-28 DIAGNOSIS — I10 Essential (primary) hypertension: Secondary | ICD-10-CM

## 2018-02-28 DIAGNOSIS — D696 Thrombocytopenia, unspecified: Secondary | ICD-10-CM

## 2018-02-28 DIAGNOSIS — D751 Secondary polycythemia: Secondary | ICD-10-CM | POA: Diagnosis not present

## 2018-02-28 DIAGNOSIS — E119 Type 2 diabetes mellitus without complications: Secondary | ICD-10-CM

## 2018-02-28 DIAGNOSIS — E7800 Pure hypercholesterolemia, unspecified: Secondary | ICD-10-CM

## 2018-02-28 MED ORDER — EPINEPHRINE 0.3 MG/0.3ML IJ SOAJ: 0.3000 mg | Freq: Once | INTRAMUSCULAR | 3 refills | Status: AC

## 2018-02-28 MED ORDER — GLUCOSE BLOOD VI STRP: ORAL_STRIP | 11 refills | Status: AC

## 2018-02-28 NOTE — Progress Notes (Signed)
Subjective:    Patient ID: Erik [name], male    DOB: Nov 21, 1955, 62 y.o.   MRN: [id number]  HPI  Here for 6 mo f/u of chronic health problems   Planning a wedding for his daughter    Wt Readings from Last 3 Encounters:  02/28/18 201 lb 8 oz (91.4 kg)  08/26/17 196 lb 8 oz (89.1 kg)  05/22/17 201 lb 3.2 oz (91.3 kg)  trying to take care of himself  27.52 kg/m   bp is stable today  No cp or palpitations or headaches or edema  No side effects to medicines  BP Readings from Last 3 Encounters:  02/28/18 122/70  08/26/17 126/82  05/22/17 122/80     DM2 Diet controlled - watching carbs (eating just a bit more) - is drinking less pepsi  Home sugar results -occ low/ no symptoms  avg glucose 117 for past 2 weeks (from [place] meter)  DM diet -diet soda / slt more carbs Exercise - 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  Symptoms- none  A1C last  Lab Results  Component Value Date   HGBA1C 6.3 02/24/2018  up from 6.0  Well controlled  Lab Results  Component Value Date   MICROALBUR <0.7 02/24/2018    Last eye exam 9/18 Had his flu shot   Lab Results  Component Value Date   CHOL 103 02/24/2018   CHOL 141 08/22/2017   CHOL 145 01/07/2017   Lab Results  Component Value Date   HDL 37.40 (L) 02/24/2018   HDL 34.60 (L) 08/22/2017   HDL 37.80 (L) 01/07/2017   Lab Results  Component Value Date   LDLCALC 39 02/24/2018   LDLCALC 82 08/22/2017   LDLCALC 91 01/07/2017   Lab Results  Component Value Date   TRIG 130.0 02/24/2018   TRIG 124.0 08/22/2017   TRIG 82.0 01/07/2017   Lab Results  Component Value Date   CHOLHDL 3 02/24/2018   CHOLHDL 4 08/22/2017   CHOLHDL 4 01/07/2017   Lab Results  Component Value Date   LDLDIRECT 56.0 08/03/2016   LDLDIRECT 75.6 05/02/2012   atorvastatin and diet  Needs to inc HDL  Polycythemia Lab Results  Component Value Date   WBC 5.9 02/24/2018   HGB 17.2 (H) 02/24/2018   HCT 49.4 02/24/2018   MCV 84.8  02/24/2018   PLT 132.0 (L) 02/24/2018  thrombocytopenia Last pl ct was 127 Fairly stable    Patient Active Problem List   Diagnosis Date Noted  . Essential hypertension 03/21/2015  . Controlled type 2 diabetes mellitus without complication, without long-term current use of insulin ([place]) 05/09/2012  . Elevated PSA 05/09/2012  . Routine general medical examination at a health care facility 05/01/2012  . Prostate cancer screening 05/01/2012  . Thrombocytopenia ([place]) 09/20/2011  . Gastritis 09/08/2011  . Polycythemia 08/29/2011  . HEMOCCULT POSITIVE STOOL 06/23/2010  . Hyperlipidemia 02/28/2009   Past Medical History:  Diagnosis Date  . Allergy    seasonal  . Arthritis   . Cataract    surgery  . Chronic headaches   . Colon polyps    hyperplastic  . Diverticulosis   . Eczema   . Elevated liver enzymes   . Esophageal stricture   . GERD (gastroesophageal reflux disease)    with HH  . Hyperlipidemia   . Hypertension   . Labile blood pressure   . Polycythemia   . Thrombocytopenia ([place]) 09/20/2011   Past Surgical History:  Procedure  Laterality Date  . Abdominal US  2002   Negative  . BACK SURGERY     Ruptured disk with staph infection  . CATARACT EXTRACTION W/ INTRAOCULAR LENS  IMPLANT, BILATERAL    . CERVICAL DISC SURGERY     with Staph infection  . CHOLECYSTECTOMY  2007  . ESOPHAGOGASTRODUODENOSCOPY  2001   Stricture/GERD and HH  . KNEE ARTHROSCOPY     right  . UPPER GASTROINTESTINAL ENDOSCOPY     Social History   Tobacco Use  . Smoking status: Former Smoker    Last attempt to quit: 04/16/1989    Years since quitting: 28.8  . Smokeless tobacco: Never Used  Substance Use Topics  . Alcohol use: Yes    Alcohol/week: 0.0 standard drinks    Comment: Rare, almost none.  . Drug use: No   Family History  Problem Relation Age of Onset  . Heart disease Father        Heart problems, CABG  . Heart failure Father   . Cancer Mother        breast CA  . Heart disease  Maternal Grandfather        MI   Allergies  Allergen Reactions  . Shellfish Allergy Anaphylaxis   Current Outpatient Medications on File Prior to Visit  Medication Sig Dispense Refill  . aspirin 81 MG tablet Take 81 mg by mouth daily.     Marland Kitchen atorvastatin (LIPITOR) 10 MG tablet Take 1 tablet (10 mg total) by mouth daily. 90 tablet 3  . Continuous Blood Gluc Sensor (FREESTYLE LIBRE SENSOR SYSTEM) MISC USE TO TEST BLOOD GLUCOSE TWICE DAILY AND AS NEEDED 7 each 3  . fluocinonide cream (LIDEX) 8.93 % Apply 1 application topically as needed.    . hydrochlorothiazide (HYDRODIURIL) 25 MG tablet Take 1 tablet (25 mg total) by mouth daily. 90 tablet 3  . ibuprofen (ADVIL) 200 MG tablet Take 200 mg by mouth as needed.    . lansoprazole (PREVACID) 30 MG capsule Take 1 capsule (30 mg total) by mouth daily. 90 capsule 3   No current facility-administered medications on file prior to visit.     Review of Systems  Constitutional: Negative for activity change, appetite change, fatigue, fever and unexpected weight change.  HENT: Negative for congestion, rhinorrhea, sore throat and trouble swallowing.   Eyes: Negative for pain, redness, itching and visual disturbance.  Respiratory: Negative for cough, chest tightness, shortness of breath and wheezing.   Cardiovascular: Negative for chest pain and palpitations.  Gastrointestinal: Negative for abdominal pain, blood in stool, constipation, diarrhea and nausea.  Endocrine: Negative for cold intolerance, heat intolerance, polydipsia and polyuria.  Genitourinary: Negative for difficulty urinating, dysuria, frequency and urgency.  Musculoskeletal: Negative for arthralgias, joint swelling and myalgias.  Skin: Negative for pallor and rash.  Neurological: Negative for dizziness, tremors, weakness, numbness and headaches.  Hematological: Negative for adenopathy. Does not bruise/bleed easily.  Psychiatric/Behavioral: Negative for decreased concentration and  dysphoric mood. The patient is not nervous/anxious.        Objective:   Physical Exam  Constitutional: He appears well-developed and well-nourished. No distress.  Well appearing   HENT:  Head: Normocephalic and atraumatic.  Mouth/Throat: Oropharynx is clear and moist.  Eyes: Pupils are equal, round, and reactive to light. Conjunctivae and EOM are normal.  Neck: Normal range of motion. Neck supple. No JVD present. Carotid bruit is not present. No thyromegaly present.  Cardiovascular: Normal rate, regular rhythm, normal heart sounds and intact distal pulses. Exam  reveals no gallop.  Pulmonary/Chest: Effort normal and breath sounds normal. No respiratory distress. He has no wheezes. He has no rales.  No crackles  Abdominal: Soft. Bowel sounds are normal. He exhibits no distension, no abdominal bruit and no mass. There is no tenderness.  Musculoskeletal: He exhibits no edema or deformity.  Lymphadenopathy:    He has no cervical adenopathy.  Neurological: He is alert. He has normal reflexes. He displays normal reflexes. No cranial nerve deficit. Coordination normal.  Skin: Skin is warm and dry. No rash noted.  Psychiatric: He has a normal mood and affect.  Pleasant           Assessment & Plan:   Problem List Items Addressed This Visit      Cardiovascular and Mediastinum   Essential hypertension    bp in fair control at this time  BP Readings from Last 1 Encounters:  02/28/18 122/70   No changes needed Most recent labs reviewed  Disc lifstyle change with low sodium diet and exercise          Endocrine   Controlled type 2 diabetes mellitus without complication, without long-term current use of insulin ([place]) - Primary    Lab Results  Component Value Date   HGBA1C 6.3 02/24/2018   This continues to be well controlled  Lab Results  Component Value Date   MICROALBUR <0.7 02/24/2018    Disc foot and eye care         Other   Hyperlipidemia    Disc goals for lipids  and reasons to control them Rev last labs with pt Rev low sat fat diet in detail Well controlled with statin and diet       Polycythemia    Caused by exposure to exhaust at work (he cannot control)  Cbc is stable No new bruising or bleeding       Thrombocytopenia (HCC)    Stable No new bruising or bleeding

## 2018-02-28 NOTE — Patient Instructions (Addendum)
If you have frequent low glucose episodes -please increase your protein intake   Keep working on healthy habits   No change in medicines   Follow up in 6 months for annual exam

## 2018-03-02 NOTE — Assessment & Plan Note (Signed)
Stable No new bruising or bleeding

## 2018-03-02 NOTE — Assessment & Plan Note (Signed)
Lab Results  Component Value Date   HGBA1C 6.3 02/24/2018   This continues to be well controlled  Lab Results  Component Value Date   MICROALBUR <0.7 02/24/2018    Disc foot and eye care

## 2018-03-02 NOTE — Assessment & Plan Note (Signed)
bp in fair control at this time  BP Readings from Last 1 Encounters:  02/28/18 122/70   No changes needed Most recent labs reviewed  Disc lifstyle change with low sodium diet and exercise

## 2018-03-02 NOTE — Assessment & Plan Note (Signed)
Caused by exposure to exhaust at work (he cannot control)  Cbc is stable No new bruising or bleeding

## 2018-03-02 NOTE — Assessment & Plan Note (Signed)
Disc goals for lipids and reasons to control them Rev last labs with pt Rev low sat fat diet in detail Well controlled with statin and diet  

## 2018-06-20 DIAGNOSIS — L821 Other seborrheic keratosis: Secondary | ICD-10-CM | POA: Diagnosis not present

## 2018-06-20 DIAGNOSIS — L300 Nummular dermatitis: Secondary | ICD-10-CM | POA: Diagnosis not present

## 2018-06-20 DIAGNOSIS — L570 Actinic keratosis: Secondary | ICD-10-CM | POA: Diagnosis not present

## 2018-06-20 DIAGNOSIS — D225 Melanocytic nevi of trunk: Secondary | ICD-10-CM | POA: Diagnosis not present

## 2018-06-20 DIAGNOSIS — D1801 Hemangioma of skin and subcutaneous tissue: Secondary | ICD-10-CM | POA: Diagnosis not present

## 2018-08-04 ENCOUNTER — Other Ambulatory Visit: Payer: Self-pay | Admitting: Family Medicine

## 2018-08-10 ENCOUNTER — Other Ambulatory Visit: Payer: Self-pay | Admitting: Family Medicine

## 2018-08-14 ENCOUNTER — Other Ambulatory Visit: Payer: Self-pay | Admitting: Family Medicine

## 2018-08-14 LAB — HM DIABETES EYE EXAM

## 2018-08-21 ENCOUNTER — Telehealth: Payer: Self-pay | Admitting: Family Medicine

## 2018-08-21 DIAGNOSIS — E119 Type 2 diabetes mellitus without complications: Secondary | ICD-10-CM

## 2018-08-21 DIAGNOSIS — I10 Essential (primary) hypertension: Secondary | ICD-10-CM

## 2018-08-21 DIAGNOSIS — R972 Elevated prostate specific antigen [PSA]: Secondary | ICD-10-CM

## 2018-08-21 DIAGNOSIS — E7800 Pure hypercholesterolemia, unspecified: Secondary | ICD-10-CM

## 2018-08-21 NOTE — Telephone Encounter (Signed)
-----   Message from Cloyd Stagers, RT sent at 08/18/2018  1:19 PM EDT ----- Regarding: Lab Orders for Friday 5.8.2020 Please place lab orders for Friday 5.8.2020, Doxy[facility]me visit on Monday 5.11.2020 Thank you, Dyke Maes RT(R)

## 2018-08-22 ENCOUNTER — Other Ambulatory Visit (INDEPENDENT_AMBULATORY_CARE_PROVIDER_SITE_OTHER): Payer: BLUE CROSS/BLUE SHIELD

## 2018-08-22 ENCOUNTER — Other Ambulatory Visit: Payer: BLUE CROSS/BLUE SHIELD

## 2018-08-22 DIAGNOSIS — E119 Type 2 diabetes mellitus without complications: Secondary | ICD-10-CM

## 2018-08-22 DIAGNOSIS — E7800 Pure hypercholesterolemia, unspecified: Secondary | ICD-10-CM

## 2018-08-22 DIAGNOSIS — I10 Essential (primary) hypertension: Secondary | ICD-10-CM

## 2018-08-22 LAB — LIPID PANEL
Cholesterol: 104 mg/dL (ref 0–200)
HDL: 39.6 mg/dL (ref >39.00)
LDL Cholesterol: 53 mg/dL (ref 0–99)
NonHDL: 64.15
Total CHOL/HDL Ratio: 3
Triglycerides: 55 mg/dL (ref 0.0–149.0)
VLDL: 11 mg/dL (ref 0.0–40.0)

## 2018-08-22 LAB — CBC WITH DIFFERENTIAL/PLATELET
Basophils Absolute: 0.1 10*3/uL (ref 0.0–0.1)
Basophils Relative: 1.1 % (ref 0.0–3.0)
Eosinophils Absolute: 0.2 10*3/uL (ref 0.0–0.7)
Eosinophils Relative: 4.2 % (ref 0.0–5.0)
HCT: 49.3 % (ref 39.0–52.0)
Hemoglobin: 17.1 g/dL — ABNORMAL HIGH (ref 13.0–17.0)
Lymphocytes Relative: 20 % (ref 12.0–46.0)
Lymphs Abs: 1.2 10*3/uL (ref 0.7–4.0)
MCHC: 34.7 g/dL (ref 30.0–36.0)
MCV: 85 fl (ref 78.0–100.0)
Monocytes Absolute: 0.7 10*3/uL (ref 0.1–1.0)
Monocytes Relative: 11.9 % (ref 3.0–12.0)
Neutro Abs: 3.6 10*3/uL (ref 1.4–7.7)
Neutrophils Relative %: 62.8 % (ref 43.0–77.0)
Platelets: 129 10*3/uL — ABNORMAL LOW (ref 150.0–400.0)
RBC: 5.8 Mil/uL (ref 4.22–5.81)
RDW: 13.5 % (ref 11.5–15.5)
WBC: 5.8 10*3/uL (ref 4.0–10.5)

## 2018-08-22 LAB — COMPREHENSIVE METABOLIC PANEL
ALT: 21 U/L (ref 0–53)
AST: 31 U/L (ref 0–37)
Albumin: 4.2 g/dL (ref 3.5–5.2)
Alkaline Phosphatase: 115 U/L (ref 39–117)
BUN: 26 mg/dL — ABNORMAL HIGH (ref 6–23)
CO2: 31 mEq/L (ref 19–32)
Calcium: 9.3 mg/dL (ref 8.4–10.5)
Chloride: 101 mEq/L (ref 96–112)
Creatinine, Ser: 0.99 mg/dL (ref 0.40–1.50)
GFR: 76.1 mL/min (ref >60.00)
Glucose, Bld: 117 mg/dL — ABNORMAL HIGH (ref 70–99)
Potassium: 3.8 mEq/L (ref 3.5–5.1)
Sodium: 140 mEq/L (ref 135–145)
Total Bilirubin: 1 mg/dL (ref 0.2–1.2)
Total Protein: 6.5 g/dL (ref 6.0–8.3)

## 2018-08-22 LAB — TSH: TSH: 1.69 u[IU]/mL (ref 0.35–4.50)

## 2018-08-22 LAB — HEMOGLOBIN A1C: Hgb A1c MFr Bld: 6.4 % (ref 4.6–6.5)

## 2018-08-25 ENCOUNTER — Encounter: Payer: Self-pay | Admitting: Family Medicine

## 2018-08-25 ENCOUNTER — Ambulatory Visit (INDEPENDENT_AMBULATORY_CARE_PROVIDER_SITE_OTHER): Payer: BLUE CROSS/BLUE SHIELD | Admitting: Family Medicine

## 2018-08-25 VITALS — Wt 195.0 lb

## 2018-08-25 DIAGNOSIS — E119 Type 2 diabetes mellitus without complications: Secondary | ICD-10-CM

## 2018-08-25 DIAGNOSIS — R972 Elevated prostate specific antigen [PSA]: Secondary | ICD-10-CM

## 2018-08-25 DIAGNOSIS — E7800 Pure hypercholesterolemia, unspecified: Secondary | ICD-10-CM | POA: Diagnosis not present

## 2018-08-25 DIAGNOSIS — Z0000 Encounter for general adult medical examination without abnormal findings: Secondary | ICD-10-CM

## 2018-08-25 DIAGNOSIS — D696 Thrombocytopenia, unspecified: Secondary | ICD-10-CM

## 2018-08-25 DIAGNOSIS — I10 Essential (primary) hypertension: Secondary | ICD-10-CM | POA: Diagnosis not present

## 2018-08-25 DIAGNOSIS — D751 Secondary polycythemia: Secondary | ICD-10-CM

## 2018-08-25 MED ORDER — LANSOPRAZOLE 30 MG PO CPDR: DELAYED_RELEASE_CAPSULE | ORAL | 3 refills | Status: DC

## 2018-08-25 MED ORDER — ATORVASTATIN CALCIUM 10 MG PO TABS: 10.0000 mg | ORAL_TABLET | Freq: Every day | ORAL | 3 refills | Status: AC

## 2018-08-25 MED ORDER — HYDROCHLOROTHIAZIDE 25 MG PO TABS: 25.0000 mg | ORAL_TABLET | Freq: Every day | ORAL | 3 refills | Status: AC

## 2018-08-25 NOTE — Assessment & Plan Note (Signed)
Per pt bp has been stable  No problems with medications Has lost weight with more activity  Enc low sodium diet

## 2018-08-25 NOTE — Assessment & Plan Note (Signed)
Stable platelet ct  No symptoms

## 2018-08-25 NOTE — Patient Instructions (Addendum)
Continue watching your diet for excess sugar/processed carbs and fats  Keep up good exercise -more is better Keep drinking lots of water Follow up with your urologist as planned or yearly  Labs look stable overall  Take good care of your feet   We will call you to schedule a 6 month follow up

## 2018-08-25 NOTE — Progress Notes (Signed)
Virtual Visit via Video Note  I connected with Erik [name] on 08/25/18 at  8:30 AM EDT by a video enabled telemedicine application and verified that I am speaking with the correct person using two identifiers.  Location: Patient: home Provider: office    I discussed the limitations of evaluation and management by telemedicine and the availability of in person appointments. The patient expressed understanding and agreed to proceed.  History of Present Illness: Pt presents for health mt visit and review of chronic medical problems   During quarantine has been able to go to work occasionally /limits time there /uses gloves and mask   Wt Readings from Last 3 Encounters:  02/28/18 201 lb 8 oz (91.4 kg)  08/26/17 196 lb 8 oz (89.1 kg)  05/22/17 201 lb 3.2 oz (91.3 kg)   Wt at home - yesterday 195 mg  He has eaten a little more but perhaps healthier   Eye exam -postponed due to covid - last fall 9/20 Had flu shot in nov Colonoscopy 4/12 with 10 y recall  Tetanus shot 12/16  bp is stable today -he has a cuff at home -usually 129/70s  No cp or palpitations or headaches or edema  No side effects to medicines  BP Readings from Last 3 Encounters:  02/28/18 122/70  08/26/17 126/82  05/22/17 122/80    Takes hctz 25 mg   Lab Results  Component Value Date   CREATININE 0.99 08/22/2018   BUN 26 (H) 08/22/2018   NA 140 08/22/2018   K 3.8 08/22/2018   CL 101 08/22/2018   CO2 31 08/22/2018   Lab Results  Component Value Date   ALT 21 08/22/2018   AST 31 08/22/2018   ALKPHOS 115 08/22/2018   BILITOT 1.0 08/22/2018   glucose 117    DM2 Lab Results  Component Value Date   HGBA1C 6.4 08/22/2018  remains well controlled  This is up from 6.3 Lab Results  Component Value Date   MICROALBUR <0.7 02/24/2018  using the [place] glucose checker  Still watching processed carbs  Work is exercise - it is very physical (gets a work out) - sweats and works hard   H/o elevated psa - he  is up to date / last visit in nov urology -no problems at all  Says psa is stable  No nocturia or symptoms  Sees urology  Hyperlipidemia Lab Results  Component Value Date   CHOL 104 08/22/2018   CHOL 103 02/24/2018   CHOL 141 08/22/2017   Lab Results  Component Value Date   HDL 39.60 08/22/2018   HDL 37.40 (L) 02/24/2018   HDL 34.60 (L) 08/22/2017   Lab Results  Component Value Date   LDLCALC 53 08/22/2018   [place] 39 02/24/2018   [place] 82 08/22/2017   Lab Results  Component Value Date   TRIG 55.0 08/22/2018   TRIG 130.0 02/24/2018   TRIG 124.0 08/22/2017   Lab Results  Component Value Date   CHOLHDL 3 08/22/2018   CHOLHDL 3 02/24/2018   CHOLHDL 4 08/22/2017   Lab Results  Component Value Date   LDLDIRECT 56.0 08/03/2016   LDLDIRECT 75.6 05/02/2012   taking atorvastatin  Is eating well  Very seldom red meat  Limited fried foods- cut way back   Polycythemia Lab Results  Component Value Date   WBC 5.8 08/22/2018   HGB 17.1 (H) 08/22/2018   HCT 49.3 08/22/2018   MCV 85.0 08/22/2018   PLT 129.0 (L) 08/22/2018  due to exp to exhaust at work  Also low platelet ct /mild  Overall both stable  No excess bleeding or bruising   Lab Results  Component Value Date   TSH 1.69 08/22/2018    He drinks lots of water - all day  occ diet pepsi   Review of Systems  Constitutional: Positive for weight loss. Negative for chills, fever and malaise/fatigue.  HENT: Negative for hearing loss and sore throat.   Eyes: Negative for blurred vision, discharge and redness.  Respiratory: Negative for cough and shortness of breath.   Cardiovascular: Negative for chest pain, palpitations and leg swelling.  Gastrointestinal: Negative for abdominal pain, blood in stool and heartburn.  Genitourinary: Negative for frequency and urgency.  Musculoskeletal: Negative for myalgias.  Skin: Negative for itching and rash.  Neurological: Negative for dizziness and headaches.   Endo/Heme/Allergies: Does not bruise/bleed easily.  Psychiatric/Behavioral: Negative for depression and memory loss. The patient is not nervous/anxious.     Patient Active Problem List   Diagnosis Date Noted  . Essential hypertension 03/21/2015  . Controlled type 2 diabetes mellitus without complication, without long-term current use of insulin ([place]) 05/09/2012  . Elevated PSA 05/09/2012  . Routine general medical examination at a health care facility 05/01/2012  . Prostate cancer screening 05/01/2012  . Thrombocytopenia ([place]) 09/20/2011  . Gastritis 09/08/2011  . Polycythemia 08/29/2011  . HEMOCCULT POSITIVE STOOL 06/23/2010  . Hyperlipidemia 02/28/2009   Past Medical History:  Diagnosis Date  . Allergy    seasonal  . Arthritis   . Cataract    surgery  . Chronic headaches   . Colon polyps    hyperplastic  . Diverticulosis   . Eczema   . Elevated liver enzymes   . Esophageal stricture   . GERD (gastroesophageal reflux disease)    with HH  . Hyperlipidemia   . Hypertension   . Labile blood pressure   . Polycythemia   . Thrombocytopenia ([place]) 09/20/2011   Past Surgical History:  Procedure Laterality Date  . Abdominal US  2002   Negative  . BACK SURGERY     Ruptured disk with staph infection  . CATARACT EXTRACTION W/ INTRAOCULAR LENS  IMPLANT, BILATERAL    . CERVICAL DISC SURGERY     with Staph infection  . CHOLECYSTECTOMY  2007  . ESOPHAGOGASTRODUODENOSCOPY  2001   Stricture/GERD and HH  . KNEE ARTHROSCOPY     right  . UPPER GASTROINTESTINAL ENDOSCOPY     Social History   Tobacco Use  . Smoking status: Former Smoker    Last attempt to quit: 04/16/1989    Years since quitting: 29.3  . Smokeless tobacco: Never Used  Substance Use Topics  . Alcohol use: Yes    Alcohol/week: 0.0 standard drinks    Comment: Rare, almost none.  . Drug use: No   Family History  Problem Relation Age of Onset  . Heart disease Father        Heart problems, CABG  . Heart  failure Father   . Cancer Mother        breast CA  . Heart disease Maternal Grandfather        MI   Allergies  Allergen Reactions  . Shellfish Allergy Anaphylaxis   Current Outpatient Medications on File Prior to Visit  Medication Sig Dispense Refill  . aspirin 81 MG tablet Take 81 mg by mouth daily.     . Continuous Blood Gluc Sensor (FREESTYLE LIBRE SENSOR SYSTEM) MISC USE TO TEST  BLOOD GLUCOSE TWICE DAILY AND AS NEEDED 7 each 3  . fluocinonide cream (LIDEX) 5.09 % Apply 1 application topically as needed.    Marland Kitchen glucose blood test strip To check glucose once daily and as needed for diabetes (freestyle libre) 50 each 11  . ibuprofen (ADVIL) 200 MG tablet Take 200 mg by mouth as needed.     No current facility-administered medications on file prior to visit.     Observations/Objective: Well appearing  Mood/affect- normal/ good  No facial swelling or asymmetry  Not hoarse/voice is normal  No cough or sob or throat clearing No acute skin changes or bruising  Mentally sharp and talkative    Assessment and Plan: Problem List Items Addressed This Visit      Cardiovascular and Mediastinum   Essential hypertension    Per pt bp has been stable  No problems with medications Has lost weight with more activity  Enc low sodium diet       Relevant Medications   atorvastatin (LIPITOR) 10 MG tablet   hydrochlorothiazide (HYDRODIURIL) 25 MG tablet     Endocrine   Controlled type 2 diabetes mellitus without complication, without long-term current use of insulin ([place])    This continues to be well controlled Lab Results  Component Value Date   HGBA1C 6.4 08/22/2018   Lab Results  Component Value Date   MICROALBUR <0.7 02/24/2018   utd eye exam Good foot care Uses libre device  Healthy diet and active job  F/u 6 mo       Relevant Medications   atorvastatin (LIPITOR) 10 MG tablet     Other   Hyperlipidemia    Disc goals for lipids and reasons to control them Rev last  labs with pt Rev low sat fat diet in detail Well controlled with atorvastatin and diet       Relevant Medications   atorvastatin (LIPITOR) 10 MG tablet   hydrochlorothiazide (HYDRODIURIL) 25 MG tablet   Polycythemia    Stable Hb and platelet count  Thought to be caused by exp to car exhaust at work  No symptoms Will continue to follow      Thrombocytopenia ([place])    Stable platelet ct  No symptoms       Routine general medical examination at a health care facility - Primary    Reviewed health habits including diet and exercise and skin cancer prevention Reviewed appropriate screening tests for age  Also reviewed health mt list, fam hx and immunization status , as well as social and family history   Up to date with health mt  Will plan on pna vaccine at 41      Elevated PSA    Per pt-stable Seeing urology          We will call you to schedule a 6 month follow up   Follow Up Instructions: Continue watching your diet for excess sugar/processed carbs and fats  Keep up good exercise -more is better Keep drinking lots of water Follow up with your urologist as planned or yearly  Labs look stable overall  Take good care of your feet   We will call you to schedule a 6 month follow up    I discussed the assessment and treatment plan with the patient. The patient was provided an opportunity to ask questions and all were answered. The patient agreed with the plan and demonstrated an understanding of the instructions.   The patient was advised to call back or seek  an in-person evaluation if the symptoms worsen or if the condition fails to improve as anticipated.    Loura Pardon, MD

## 2018-08-25 NOTE — Assessment & Plan Note (Signed)
Disc goals for lipids and reasons to control them Rev last labs with pt Rev low sat fat diet in detail Well controlled with atorvastatin and diet

## 2018-08-25 NOTE — Assessment & Plan Note (Signed)
Per pt-stable Seeing urology

## 2018-08-25 NOTE — Assessment & Plan Note (Signed)
This continues to be well controlled Lab Results  Component Value Date   HGBA1C 6.4 08/22/2018   Lab Results  Component Value Date   MICROALBUR <0.7 02/24/2018   utd eye exam Good foot care Uses libre device  Healthy diet and active job  F/u 6 mo

## 2018-08-25 NOTE — Assessment & Plan Note (Signed)
Stable Hb and platelet count  Thought to be caused by exp to car exhaust at work  No symptoms Will continue to follow

## 2018-08-25 NOTE — Assessment & Plan Note (Addendum)
Reviewed health habits including diet and exercise and skin cancer prevention Reviewed appropriate screening tests for age  Also reviewed health mt list, fam hx and immunization status , as well as social and family history   Up to date with health mt  Will plan on pna vaccine at 70

## 2018-09-18 ENCOUNTER — Other Ambulatory Visit: Payer: Self-pay | Admitting: Family Medicine

## 2019-01-05 DIAGNOSIS — R972 Elevated prostate specific antigen [PSA]: Secondary | ICD-10-CM | POA: Diagnosis not present

## 2019-01-05 DIAGNOSIS — Z125 Encounter for screening for malignant neoplasm of prostate: Secondary | ICD-10-CM | POA: Diagnosis not present

## 2019-01-12 DIAGNOSIS — M25561 Pain in right knee: Secondary | ICD-10-CM | POA: Diagnosis not present

## 2019-01-14 ENCOUNTER — Other Ambulatory Visit: Payer: Self-pay

## 2019-01-14 ENCOUNTER — Encounter: Payer: Self-pay | Admitting: Family Medicine

## 2019-01-14 ENCOUNTER — Ambulatory Visit (INDEPENDENT_AMBULATORY_CARE_PROVIDER_SITE_OTHER): Payer: BC Managed Care – PPO | Admitting: Family Medicine

## 2019-01-14 VITALS — BP 110/80 | HR 72 | Temp 97.1°F | Ht 71.75 in | Wt 205.0 lb

## 2019-01-14 DIAGNOSIS — E119 Type 2 diabetes mellitus without complications: Secondary | ICD-10-CM | POA: Diagnosis not present

## 2019-01-14 DIAGNOSIS — E7800 Pure hypercholesterolemia, unspecified: Secondary | ICD-10-CM | POA: Diagnosis not present

## 2019-01-14 DIAGNOSIS — D751 Secondary polycythemia: Secondary | ICD-10-CM

## 2019-01-14 DIAGNOSIS — Z01818 Encounter for other preprocedural examination: Secondary | ICD-10-CM

## 2019-01-14 DIAGNOSIS — I10 Essential (primary) hypertension: Secondary | ICD-10-CM | POA: Diagnosis not present

## 2019-01-14 DIAGNOSIS — E1169 Type 2 diabetes mellitus with other specified complication: Secondary | ICD-10-CM

## 2019-01-14 DIAGNOSIS — D696 Thrombocytopenia, unspecified: Secondary | ICD-10-CM

## 2019-01-14 DIAGNOSIS — E785 Hyperlipidemia, unspecified: Secondary | ICD-10-CM

## 2019-01-14 DIAGNOSIS — R972 Elevated prostate specific antigen [PSA]: Secondary | ICD-10-CM

## 2019-01-14 NOTE — Assessment & Plan Note (Signed)
Followed by urology Pt has bx planned No symptoms

## 2019-01-14 NOTE — Patient Instructions (Addendum)
Labs today   Keep eating healthy  Take care of yourself  Try and find out what drug you reacted to with surgery in the past and let us know   I will send forms to orthopedics   Get your eye exam as planned   Try and get your flu shot before your surgery

## 2019-01-14 NOTE — Assessment & Plan Note (Signed)
Cbc today  Has seen hematology in the past  2nd to his exp to exhaust fumes at work  Also mild low platelet

## 2019-01-14 NOTE — Assessment & Plan Note (Signed)
bp in fair control at this time  BP Readings from Last 1 Encounters:  01/14/19 110/80   No changes needed Most recent labs reviewed  Disc lifstyle change with low sodium diet and exercise

## 2019-01-14 NOTE — Assessment & Plan Note (Signed)
Disc goals for lipids and reasons to control them Rev last labs with pt Rev low sat fat diet in detail Has been well controlled with atorvastatin and diet  Lab today Goal of LDL under 70

## 2019-01-14 NOTE — Assessment & Plan Note (Signed)
Reviewed chronic medical problems-in control  Pending labs  Suspect he will be cleared for orthopedic surgery  No change in EKG No h/o heart or lung dz  Pt mentions he was intolerant of an anesthesia drug in the past- he plans to find out what it is

## 2019-01-14 NOTE — Assessment & Plan Note (Signed)
Labs today in setting of impending surgery Has seen hematology in the past  No bleeding or bruising Thought to be due to exp to exhaust fumes at work

## 2019-01-14 NOTE — Assessment & Plan Note (Signed)
Labs today for A1C Has been well controlled with diet  On statin  utd eye exam Good foot care

## 2019-01-14 NOTE — Progress Notes (Signed)
Subjective:    Patient ID: Erik [name], male    DOB: [birth year], 63 y.o.   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  (shot helped once in the past)  Pain is worse esp during weather change  Has been walking - able to walk 2 mi with pain /worse when he is tired  Also steps/ladders are tough  Has not played paint ball since January   Wt Readings from Last 3 Encounters:  01/14/19 205 lb (93 kg)  08/25/18 195 lb (88.5 kg)  02/28/18 201 lb 8 oz (91.4 kg)  he gained weight on vacation (by his scale gained 3-4 lb)  Trying to get back to 193  28.00 kg/m   HTN bp is stable today  No cp or palpitations or headaches or edema  No side effects to medicines  BP Readings from Last 3 Encounters:  01/14/19 110/80  02/28/18 122/70  08/26/17 126/82    Taking hctz   EKG today: NSR with rate of 65 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 (no change from last ekg)  Had nl ETT in 2013  No known heart dz he knows of  No lung dz either No cp or sob at all   Had a rxn to drug in 04 during neck surgery-unsure what it was  No n/v with anesthesia  No h/o blood clot   psa up a bit so has bx scheduled -not very worried about it     DM2 Lab Results  Component Value Date   HGBA1C 6.4 08/22/2018   taking statin -atorvastatin  No DM symptoms  Controls with diet alone  Eye exam - has it scheduled in December  Last one 5 mo ago No retinopathy   Lab Results  Component Value Date   LDLCALC 53 08/22/2018   Lab Results  Component Value Date   MICROALBUR <0.7 02/24/2018     Polycythemia Lab Results  Component Value Date   WBC 5.8 08/22/2018   HGB 17.1 (H) 08/22/2018   HCT 49.3 08/22/2018   MCV 85.0 08/22/2018   PLT 129.0 (L) 08/22/2018   From being around exhaust fumes  Has seen hematology  This was stable from previous  readings   H/o shellfish allergy  Patient Active Problem List   Diagnosis Date Noted  . Encounter for pre-operative examination 01/14/2019  . Essential hypertension 03/21/2015  . Controlled type 2 diabetes mellitus without complication, without long-term current use of insulin ([place]) 05/09/2012  . Elevated PSA 05/09/2012  . Routine general medical examination at a health care facility 05/01/2012  . Prostate cancer screening 05/01/2012  . Thrombocytopenia ([place]) 09/20/2011  . Polycythemia 08/29/2011  . Hyperlipidemia associated with type 2 diabetes mellitus ([place]) 02/28/2009   Past Medical History:  Diagnosis Date  . Allergy    seasonal  . Arthritis   . Cataract    surgery  . Chronic headaches   . Colon polyps    hyperplastic  . Diverticulosis   . Eczema   . Elevated liver enzymes   . Esophageal stricture   . GERD (gastroesophageal reflux disease)    with HH  . Hyperlipidemia   . Hypertension   . Labile blood pressure   . Polycythemia   . Thrombocytopenia ([place]) 09/20/2011   Past Surgical History:  Procedure Laterality Date  .  Abdominal US  2002   Negative  . BACK SURGERY     Ruptured disk with staph infection  . CATARACT EXTRACTION W/ INTRAOCULAR LENS  IMPLANT, BILATERAL    . CERVICAL DISC SURGERY     with Staph infection  . CHOLECYSTECTOMY  2007  . ESOPHAGOGASTRODUODENOSCOPY  2001   Stricture/GERD and HH  . KNEE ARTHROSCOPY     right  . UPPER GASTROINTESTINAL ENDOSCOPY     Social History   Tobacco Use  . Smoking status: Former Smoker    Quit date: 04/16/1989    Years since quitting: 29.7  . Smokeless tobacco: Never Used  Substance Use Topics  . Alcohol use: Yes    Alcohol/week: 0.0 standard drinks    Comment: Rare, almost none.  . Drug use: No   Family History  Problem Relation Age of Onset  . Heart disease Father        Heart problems, CABG  . Heart failure Father   . Cancer Mother        breast CA  . Heart disease Maternal Grandfather        MI    Allergies  Allergen Reactions  . Shellfish Allergy Anaphylaxis   Current Outpatient Medications on File Prior to Visit  Medication Sig Dispense Refill  . aspirin 81 MG tablet Take 81 mg by mouth daily.     Marland Kitchen atorvastatin (LIPITOR) 10 MG tablet Take 1 tablet (10 mg total) by mouth daily. 90 tablet 3  . Continuous Blood Gluc Sensor (FREESTYLE LIBRE 14 DAY SENSOR) MISC USE TO TEST BLOOD GLUCOSE TWICE DAILY AND AS NEEDED 2 each 5  . glucose blood test strip To check glucose once daily and as needed for diabetes (freestyle libre) 50 each 11  . hydrochlorothiazide (HYDRODIURIL) 25 MG tablet Take 1 tablet (25 mg total) by mouth daily. 90 tablet 3  . ibuprofen (ADVIL) 200 MG tablet Take 200 mg by mouth as needed.    . lansoprazole (PREVACID) 30 MG capsule TAKE 1 CAPSULE BY MOUTH EVERY DAY 90 capsule 3  . naproxen sodium (ALEVE) 220 MG tablet Take 220 mg by mouth 2 (two) times daily as needed.    . triamcinolone cream (KENALOG) 0.1 % APPLY TO AFFECTED AREA TWICE A DAY     No current facility-administered medications on file prior to visit.     Review of Systems  Constitutional: Negative for activity change, appetite change, fatigue, fever and unexpected weight change.  HENT: Negative for congestion, rhinorrhea, sore throat and trouble swallowing.   Eyes: Negative for pain, redness, itching and visual disturbance.  Respiratory: Negative for cough, chest tightness, shortness of breath and wheezing.   Cardiovascular: Negative for chest pain and palpitations.  Gastrointestinal: Negative for abdominal pain, blood in stool, constipation, diarrhea and nausea.  Endocrine: Negative for cold intolerance, heat intolerance, polydipsia and polyuria.  Genitourinary: Negative for difficulty urinating, dysuria, frequency and urgency.  Musculoskeletal: Positive for arthralgias. Negative for joint swelling and myalgias.  Skin: Negative for pallor and rash.  Neurological: Negative for dizziness, tremors, weakness,  numbness and headaches.  Hematological: Negative for adenopathy. Does not bruise/bleed easily.  Psychiatric/Behavioral: Negative for decreased concentration and dysphoric mood. The patient is not nervous/anxious.        Objective:   Physical Exam Constitutional:      General: He is not in acute distress.    Appearance: Normal appearance. He is well-developed and normal weight. He is not ill-appearing or diaphoretic.  HENT:  Head: Normocephalic and atraumatic.     Right Ear: Tympanic membrane, ear canal and external ear normal.     Left Ear: Tympanic membrane, ear canal and external ear normal.     Nose: Nose normal. No congestion.     Mouth/Throat:     Mouth: Mucous membranes are moist.     Pharynx: Oropharynx is clear. No posterior oropharyngeal erythema.  Eyes:     General: No scleral icterus.       Right eye: No discharge.        Left eye: No discharge.     Conjunctiva/sclera: Conjunctivae normal.     Pupils: Pupils are equal, round, and reactive to light.  Neck:     Musculoskeletal: Normal range of motion and neck supple. No neck rigidity or muscular tenderness.     Thyroid: No thyromegaly.     Vascular: No carotid bruit or JVD.  Cardiovascular:     Rate and Rhythm: Normal rate and regular rhythm.     Pulses: Normal pulses.     Heart sounds: Normal heart sounds. No gallop.   Pulmonary:     Effort: Pulmonary effort is normal. No respiratory distress.     Breath sounds: Normal breath sounds. No wheezing or rales.     Comments: Good air exch Chest:     Chest wall: No tenderness.  Abdominal:     General: Bowel sounds are normal. There is no distension or abdominal bruit.     Palpations: Abdomen is soft. There is no mass.     Tenderness: There is no abdominal tenderness.     Hernia: No hernia is present.  Musculoskeletal:        General: No tenderness or signs of injury.     Right lower leg: No edema.     Left lower leg: No edema.     Comments: Limited rom of knees   Lymphadenopathy:     Cervical: No cervical adenopathy.  Skin:    General: Skin is warm and dry.     Coloration: Skin is not pale.     Findings: No erythema or rash.     Comments: No bruising or petechia    Neurological:     Mental Status: He is alert.     Cranial Nerves: No cranial nerve deficit.     Motor: No abnormal muscle tone.     Coordination: Coordination normal.     Gait: Gait normal.     Deep Tendon Reflexes: Reflexes are normal and symmetric. Reflexes normal.  Psychiatric:        Mood and Affect: Mood normal.        Cognition and Memory: Cognition normal.           Assessment & Plan:   Problem List Items Addressed This Visit      Cardiovascular and Mediastinum   Essential hypertension    bp in fair control at this time  BP Readings from Last 1 Encounters:  01/14/19 110/80   No changes needed Most recent labs reviewed  Disc lifstyle change with low sodium diet and exercise        Relevant Orders   CBC with Differential/Platelet   Comprehensive metabolic panel     Endocrine   Hyperlipidemia associated with type 2 diabetes mellitus ([place])    Disc goals for lipids and reasons to control them Rev last labs with pt Rev low sat fat diet in detail Has been well controlled with atorvastatin and diet  Lab today  Goal of LDL under 70      Controlled type 2 diabetes mellitus without complication, without long-term current use of insulin ([place])    Labs today for A1C Has been well controlled with diet  On statin  utd eye exam Good foot care        Relevant Orders   Hemoglobin A1c     Other   Polycythemia    Cbc today  Has seen hematology in the past  2nd to his exp to exhaust fumes at work  Also mild low platelet      Relevant Orders   CBC with Differential/Platelet   Thrombocytopenia ([place])    Labs today in setting of impending surgery Has seen hematology in the past  No bleeding or bruising Thought to be due to exp to exhaust fumes at work       Relevant Orders   CBC with Differential/Platelet   Elevated PSA    Followed by urology Pt has bx planned No symptoms      Encounter for pre-operative examination - Primary    Reviewed chronic medical problems-in control  Pending labs  Suspect he will be cleared for orthopedic surgery  No change in EKG No h/o heart or lung dz  Pt mentions he was intolerant of an anesthesia drug in the past- he plans to find out what it is         Other Visit Diagnoses    Pre-op exam       Relevant Orders   EKG 12-Lead (Completed)

## 2019-01-15 HISTORY — PX: TOTAL KNEE ARTHROPLASTY: SHX125

## 2019-01-15 LAB — CBC WITH DIFFERENTIAL/PLATELET
Basophils Absolute: 0.1 10*3/uL (ref 0.0–0.1)
Basophils Relative: 1.6 % (ref 0.0–3.0)
Eosinophils Absolute: 0.2 10*3/uL (ref 0.0–0.7)
Eosinophils Relative: 3.5 % (ref 0.0–5.0)
HCT: 49 % (ref 39.0–52.0)
Hemoglobin: 16.8 g/dL (ref 13.0–17.0)
Lymphocytes Relative: 21.7 % (ref 12.0–46.0)
Lymphs Abs: 1.5 10*3/uL (ref 0.7–4.0)
MCHC: 34.4 g/dL (ref 30.0–36.0)
MCV: 85.4 fl (ref 78.0–100.0)
Monocytes Absolute: 0.6 10*3/uL (ref 0.1–1.0)
Monocytes Relative: 8.8 % (ref 3.0–12.0)
Neutro Abs: 4.3 10*3/uL (ref 1.4–7.7)
Neutrophils Relative %: 64.4 % (ref 43.0–77.0)
Platelets: 138 10*3/uL — ABNORMAL LOW (ref 150.0–400.0)
RBC: 5.73 Mil/uL (ref 4.22–5.81)
RDW: 13.3 % (ref 11.5–15.5)
WBC: 6.7 10*3/uL (ref 4.0–10.5)

## 2019-01-15 LAB — COMPREHENSIVE METABOLIC PANEL
ALT: 26 U/L (ref 0–53)
AST: 26 U/L (ref 0–37)
Albumin: 4.1 g/dL (ref 3.5–5.2)
Alkaline Phosphatase: 107 U/L (ref 39–117)
BUN: 17 mg/dL (ref 6–23)
CO2: 34 mEq/L — ABNORMAL HIGH (ref 19–32)
Calcium: 9.9 mg/dL (ref 8.4–10.5)
Chloride: 97 mEq/L (ref 96–112)
Creatinine, Ser: 1.07 mg/dL (ref 0.40–1.50)
GFR: 69.48 mL/min (ref >60.00)
Glucose, Bld: 86 mg/dL (ref 70–99)
Potassium: 3.7 mEq/L (ref 3.5–5.1)
Sodium: 138 mEq/L (ref 135–145)
Total Bilirubin: 1 mg/dL (ref 0.2–1.2)
Total Protein: 6.1 g/dL (ref 6.0–8.3)

## 2019-01-15 LAB — LIPID PANEL
Cholesterol: 118 mg/dL (ref 0–200)
HDL: 37.7 mg/dL — ABNORMAL LOW (ref >39.00)
LDL Cholesterol: 49 mg/dL (ref 0–99)
NonHDL: 80.38
Total CHOL/HDL Ratio: 3
Triglycerides: 156 mg/dL — ABNORMAL HIGH (ref 0.0–149.0)
VLDL: 31.2 mg/dL (ref 0.0–40.0)

## 2019-01-15 LAB — HEMOGLOBIN A1C: Hgb A1c MFr Bld: 6.4 % (ref 4.6–6.5)

## 2019-01-19 ENCOUNTER — Telehealth: Payer: Self-pay | Admitting: Family Medicine

## 2019-01-19 DIAGNOSIS — M25561 Pain in right knee: Secondary | ICD-10-CM | POA: Diagnosis not present

## 2019-01-19 NOTE — Telephone Encounter (Signed)
Please review if appointments in November are still needed. Thank you

## 2019-01-19 NOTE — Telephone Encounter (Signed)
Patient's wife called today in regards to the patient's follow up appointments in November She stated that the patient was just in the office  9/30 for surgery clearance and labs were done then They would like to know if this appointment is still needed.  Wife requested a call back from the nurse to further discuss

## 2019-01-19 NOTE — Telephone Encounter (Signed)
They can re schedule it for 6 mo from now Thanks

## 2019-01-20 NOTE — Telephone Encounter (Signed)
Patient's wife rescheduled appointments to April.

## 2019-01-26 DIAGNOSIS — M25561 Pain in right knee: Secondary | ICD-10-CM | POA: Diagnosis not present

## 2019-01-26 DIAGNOSIS — M1711 Unilateral primary osteoarthritis, right knee: Secondary | ICD-10-CM | POA: Diagnosis not present

## 2019-02-12 DIAGNOSIS — M1711 Unilateral primary osteoarthritis, right knee: Secondary | ICD-10-CM | POA: Diagnosis not present

## 2019-02-27 ENCOUNTER — Ambulatory Visit: Payer: BLUE CROSS/BLUE SHIELD | Admitting: Family Medicine

## 2019-02-27 ENCOUNTER — Other Ambulatory Visit: Payer: BLUE CROSS/BLUE SHIELD

## 2019-03-02 ENCOUNTER — Ambulatory Visit: Payer: BLUE CROSS/BLUE SHIELD | Admitting: Family Medicine

## 2019-03-31 LAB — HM DIABETES EYE EXAM

## 2019-04-08 ENCOUNTER — Encounter: Payer: Self-pay | Admitting: Family Medicine

## 2019-04-28 ENCOUNTER — Other Ambulatory Visit: Payer: Self-pay | Admitting: Family Medicine

## 2019-04-28 NOTE — Telephone Encounter (Signed)
Patient's wife left a voicemail stating that he will be out of his sensors tonight and needs the refill today. Patient's wife stated that he needs a 3 month supply sent to the pharmacy.

## 2019-04-28 NOTE — Telephone Encounter (Signed)
Rx sent 

## 2019-06-12 DIAGNOSIS — R972 Elevated prostate specific antigen [PSA]: Secondary | ICD-10-CM | POA: Diagnosis not present

## 2019-06-21 ENCOUNTER — Ambulatory Visit: Payer: BC Managed Care – PPO | Attending: Internal Medicine

## 2019-06-21 DIAGNOSIS — Z23 Encounter for immunization: Secondary | ICD-10-CM | POA: Insufficient documentation

## 2019-06-21 NOTE — Progress Notes (Signed)
   Covid-19 Vaccination Clinic  Name:  Erik [name]    MRN: [id number] DOB: 1956/04/14  06/21/2019  Erik [name] was observed post Covid-19 immunization for 15 minutes without incident. He was provided with Vaccine Information Sheet and instruction to access the V-Safe system.   Erik [name] was instructed to call 911 with any severe reactions post vaccine: Marland Kitchen Difficulty breathing  . Swelling of face and throat  . A fast heartbeat  . A bad rash all over body  . Dizziness and weakness   Immunizations Administered    Name Date Dose VIS Date Route   Pfizer COVID-19 Vaccine 06/21/2019 12:11 PM 0.3 mL 03/27/2019 Intramuscular   Manufacturer: [place]   Lot: [id number]   [place]: [id number]

## 2019-06-22 DIAGNOSIS — L570 Actinic keratosis: Secondary | ICD-10-CM | POA: Diagnosis not present

## 2019-06-22 DIAGNOSIS — D225 Melanocytic nevi of trunk: Secondary | ICD-10-CM | POA: Diagnosis not present

## 2019-06-22 DIAGNOSIS — D1801 Hemangioma of skin and subcutaneous tissue: Secondary | ICD-10-CM | POA: Diagnosis not present

## 2019-06-22 DIAGNOSIS — L738 Other specified follicular disorders: Secondary | ICD-10-CM | POA: Diagnosis not present

## 2019-06-22 DIAGNOSIS — L821 Other seborrheic keratosis: Secondary | ICD-10-CM | POA: Diagnosis not present

## 2019-06-25 DIAGNOSIS — R972 Elevated prostate specific antigen [PSA]: Secondary | ICD-10-CM | POA: Diagnosis not present

## 2019-07-08 ENCOUNTER — Telehealth: Payer: Self-pay | Admitting: Family Medicine

## 2019-07-08 DIAGNOSIS — I10 Essential (primary) hypertension: Secondary | ICD-10-CM

## 2019-07-08 DIAGNOSIS — E785 Hyperlipidemia, unspecified: Secondary | ICD-10-CM

## 2019-07-08 DIAGNOSIS — E1169 Type 2 diabetes mellitus with other specified complication: Secondary | ICD-10-CM

## 2019-07-08 DIAGNOSIS — E119 Type 2 diabetes mellitus without complications: Secondary | ICD-10-CM

## 2019-07-08 NOTE — Telephone Encounter (Signed)
-----   Message from Cloyd Stagers, RT sent at 07/02/2019  1:35 PM EDT ----- Regarding: Lab Orders for Thursday 4.1.2021 Please place lab orders for Thursday 4.1.2021, office visit for 6 month f/u on Monday 4.5.2021 Thank you, Dyke Maes RT(R)

## 2019-07-14 ENCOUNTER — Ambulatory Visit: Payer: BC Managed Care – PPO | Attending: Internal Medicine

## 2019-07-14 DIAGNOSIS — Z23 Encounter for immunization: Secondary | ICD-10-CM

## 2019-07-14 NOTE — Progress Notes (Signed)
   Covid-19 Vaccination Clinic  Name:  Erik [name]    MRN: [id number] DOB: [birth year]  07/14/2019  Erik [name] was observed post Covid-19 immunization for 15 minutes without incident. He was provided with Vaccine Information Sheet and instruction to access the V-Safe system.   Erik [name] was instructed to call 911 with any severe reactions post vaccine: Marland Kitchen Difficulty breathing  . Swelling of face and throat  . A fast heartbeat  . A bad rash all over body  . Dizziness and weakness   Immunizations Administered    Name Date Dose VIS Date Route   Pfizer COVID-19 Vaccine 07/14/2019  9:07 AM 0.3 mL 03/27/2019 Intramuscular   Manufacturer: [place]   Lot: [id number]   [place]: [id number]

## 2019-07-16 ENCOUNTER — Other Ambulatory Visit: Payer: Self-pay

## 2019-07-16 ENCOUNTER — Other Ambulatory Visit (INDEPENDENT_AMBULATORY_CARE_PROVIDER_SITE_OTHER): Payer: BC Managed Care – PPO

## 2019-07-16 DIAGNOSIS — E119 Type 2 diabetes mellitus without complications: Secondary | ICD-10-CM

## 2019-07-16 DIAGNOSIS — E1169 Type 2 diabetes mellitus with other specified complication: Secondary | ICD-10-CM | POA: Diagnosis not present

## 2019-07-16 DIAGNOSIS — I10 Essential (primary) hypertension: Secondary | ICD-10-CM

## 2019-07-16 DIAGNOSIS — E785 Hyperlipidemia, unspecified: Secondary | ICD-10-CM | POA: Diagnosis not present

## 2019-07-16 LAB — LIPID PANEL
Cholesterol: 98 mg/dL (ref 0–200)
HDL: 32.3 mg/dL — ABNORMAL LOW (ref >39.00)
LDL Cholesterol: 49 mg/dL (ref 0–99)
NonHDL: 65.98
Total CHOL/HDL Ratio: 3
Triglycerides: 83 mg/dL (ref 0.0–149.0)
VLDL: 16.6 mg/dL (ref 0.0–40.0)

## 2019-07-16 LAB — COMPREHENSIVE METABOLIC PANEL
ALT: 20 U/L (ref 0–53)
AST: 23 U/L (ref 0–37)
Albumin: 4.1 g/dL (ref 3.5–5.2)
Alkaline Phosphatase: 139 U/L — ABNORMAL HIGH (ref 39–117)
BUN: 18 mg/dL (ref 6–23)
CO2: 32 mEq/L (ref 19–32)
Calcium: 9.5 mg/dL (ref 8.4–10.5)
Chloride: 98 mEq/L (ref 96–112)
Creatinine, Ser: 1.08 mg/dL (ref 0.40–1.50)
GFR: 68.63 mL/min (ref >60.00)
Glucose, Bld: 147 mg/dL — ABNORMAL HIGH (ref 70–99)
Potassium: 3.9 mEq/L (ref 3.5–5.1)
Sodium: 137 mEq/L (ref 135–145)
Total Bilirubin: 1 mg/dL (ref 0.2–1.2)
Total Protein: 6.1 g/dL (ref 6.0–8.3)

## 2019-07-16 LAB — HEMOGLOBIN A1C: Hgb A1c MFr Bld: 6.4 % (ref 4.6–6.5)

## 2019-07-17 ENCOUNTER — Other Ambulatory Visit: Payer: BC Managed Care – PPO

## 2019-07-20 ENCOUNTER — Ambulatory Visit (INDEPENDENT_AMBULATORY_CARE_PROVIDER_SITE_OTHER): Payer: BC Managed Care – PPO | Admitting: Family Medicine

## 2019-07-20 ENCOUNTER — Other Ambulatory Visit: Payer: Self-pay

## 2019-07-20 ENCOUNTER — Encounter: Payer: Self-pay | Admitting: Family Medicine

## 2019-07-20 VITALS — BP 130/80 | HR 71 | Temp 97.4°F | Ht 71.75 in | Wt 200.4 lb

## 2019-07-20 DIAGNOSIS — I10 Essential (primary) hypertension: Secondary | ICD-10-CM | POA: Diagnosis not present

## 2019-07-20 DIAGNOSIS — R748 Abnormal levels of other serum enzymes: Secondary | ICD-10-CM | POA: Diagnosis not present

## 2019-07-20 DIAGNOSIS — E119 Type 2 diabetes mellitus without complications: Secondary | ICD-10-CM | POA: Diagnosis not present

## 2019-07-20 DIAGNOSIS — E785 Hyperlipidemia, unspecified: Secondary | ICD-10-CM

## 2019-07-20 DIAGNOSIS — D696 Thrombocytopenia, unspecified: Secondary | ICD-10-CM

## 2019-07-20 DIAGNOSIS — E1169 Type 2 diabetes mellitus with other specified complication: Secondary | ICD-10-CM | POA: Diagnosis not present

## 2019-07-20 LAB — MICROALBUMIN / CREATININE URINE RATIO
Creatinine,U: 87.9 mg/dL
Microalb Creat Ratio: 0.8 mg/g (ref 0.0–30.0)
Microalb, Ur: <0.7 mg/dL (ref 0.0–1.9)

## 2019-07-20 NOTE — Assessment & Plan Note (Signed)
Suspect due to TKA this year (also had covid vaccine several days before draw)  Will follow

## 2019-07-20 NOTE — Patient Instructions (Addendum)
Stay active -consider structured exercise (this would help the HDL /good cholesterol)   I think your elevated alk phos is due to the vaccine you had and also the recent knee replacement  We will watch this   Take care of yourself  Leave a urine sample on the way out for microalbumin test

## 2019-07-20 NOTE — Assessment & Plan Note (Signed)
Stable Disc goals for lipids and reasons to control them Rev last labs with pt Rev low sat fat diet in detail HDL is low-enc more exercise  Continues atorvastatin

## 2019-07-20 NOTE — Assessment & Plan Note (Signed)
Stable Lab Results  Component Value Date   HGBA1C 6.4 07/16/2019   Libre meter works well  Enc more exercise and wt loss microalb today  Nl foot exam  [facility]  Good lipid control
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
## 2019-07-20 NOTE — Assessment & Plan Note (Signed)
bp in fair control at this time  BP Readings from Last 1 Encounters:  07/20/19 130/80   No changes needed Most recent labs reviewed  Disc lifstyle change with low sodium diet and exercise

## 2019-07-20 NOTE — Assessment & Plan Note (Signed)
With polycythemia in pt who works around car exhaust Has been clinically stable

## 2019-07-21 ENCOUNTER — Encounter: Payer: Self-pay | Admitting: *Deleted

## 2019-08-27 ENCOUNTER — Other Ambulatory Visit: Payer: Self-pay | Admitting: Family Medicine

## 2019-09-28 DIAGNOSIS — R918 Other nonspecific abnormal finding of lung field: Secondary | ICD-10-CM | POA: Diagnosis not present

## 2019-09-28 DIAGNOSIS — S299XXA Unspecified injury of thorax, initial encounter: Secondary | ICD-10-CM | POA: Diagnosis not present

## 2019-09-28 DIAGNOSIS — I21A1 Myocardial infarction type 2: Secondary | ICD-10-CM | POA: Diagnosis not present

## 2019-09-28 DIAGNOSIS — Y92832 Beach as the place of occurrence of the external cause: Secondary | ICD-10-CM | POA: Diagnosis not present

## 2019-09-28 DIAGNOSIS — J9602 Acute respiratory failure with hypercapnia: Secondary | ICD-10-CM | POA: Diagnosis not present

## 2019-09-28 DIAGNOSIS — E874 Mixed disorder of acid-base balance: Secondary | ICD-10-CM | POA: Diagnosis not present

## 2019-09-28 DIAGNOSIS — J9601 Acute respiratory failure with hypoxia: Secondary | ICD-10-CM | POA: Diagnosis not present

## 2019-09-28 DIAGNOSIS — R040 Epistaxis: Secondary | ICD-10-CM | POA: Diagnosis not present

## 2019-09-28 DIAGNOSIS — G931 Anoxic brain damage, not elsewhere classified: Secondary | ICD-10-CM | POA: Diagnosis not present

## 2019-09-28 DIAGNOSIS — T751XXA Unspecified effects of drowning and nonfatal submersion, initial encounter: Secondary | ICD-10-CM | POA: Diagnosis not present

## 2019-09-28 DIAGNOSIS — D689 Coagulation defect, unspecified: Secondary | ICD-10-CM | POA: Diagnosis not present

## 2019-09-28 DIAGNOSIS — R0689 Other abnormalities of breathing: Secondary | ICD-10-CM | POA: Diagnosis not present

## 2019-09-28 DIAGNOSIS — I482 Chronic atrial fibrillation, unspecified: Secondary | ICD-10-CM | POA: Diagnosis not present

## 2019-09-28 DIAGNOSIS — D696 Thrombocytopenia, unspecified: Secondary | ICD-10-CM | POA: Diagnosis not present

## 2019-09-28 DIAGNOSIS — I4891 Unspecified atrial fibrillation: Secondary | ICD-10-CM | POA: Diagnosis not present

## 2019-09-28 DIAGNOSIS — I469 Cardiac arrest, cause unspecified: Secondary | ICD-10-CM | POA: Diagnosis not present

## 2019-09-28 DIAGNOSIS — R404 Transient alteration of awareness: Secondary | ICD-10-CM | POA: Diagnosis not present

## 2019-09-28 DIAGNOSIS — N179 Acute kidney failure, unspecified: Secondary | ICD-10-CM | POA: Diagnosis not present

## 2019-09-28 DIAGNOSIS — R000 Tachycardia, unspecified: Secondary | ICD-10-CM | POA: Diagnosis not present

## 2019-09-28 DIAGNOSIS — I499 Cardiac arrhythmia, unspecified: Secondary | ICD-10-CM | POA: Diagnosis not present

## 2019-09-28 DIAGNOSIS — S022XXA Fracture of nasal bones, initial encounter for closed fracture: Secondary | ICD-10-CM | POA: Diagnosis not present

## 2019-09-28 DIAGNOSIS — S0990XA Unspecified injury of head, initial encounter: Secondary | ICD-10-CM | POA: Diagnosis not present

## 2019-09-28 DIAGNOSIS — R4020 Unspecified coma: Secondary | ICD-10-CM | POA: Diagnosis not present

## 2019-09-28 DIAGNOSIS — E1165 Type 2 diabetes mellitus with hyperglycemia: Secondary | ICD-10-CM | POA: Diagnosis not present

## 2019-09-29 ENCOUNTER — Telehealth: Payer: Self-pay | Admitting: Family Medicine
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
## 2019-09-29 NOTE — Telephone Encounter (Signed)
Chart updated

## 2019-10-15 DIAGNOSIS — 419620001 Death: Secondary | SNOMED CT | POA: Diagnosis not present

## 2019-10-15 DEATH — deceased

## 2019-10-30 ENCOUNTER — Other Ambulatory Visit: Payer: Self-pay | Admitting: Family Medicine

## 2020-01-15 ENCOUNTER — Other Ambulatory Visit: Payer: BC Managed Care – PPO

## 2020-01-20 ENCOUNTER — Encounter: Payer: BC Managed Care – PPO | Admitting: Family Medicine
